# Patient Record
Sex: Female | Born: 1935 | Race: Black or African American | Hispanic: No | Marital: Married | Smoking: Never smoker
Health system: Southern US, Community
[De-identification: ages and names within clinical notes are randomized; demographics above are authoritative.]

## PROBLEM LIST (undated history)

## (undated) DIAGNOSIS — I10 Essential (primary) hypertension: Secondary | ICD-10-CM

## (undated) DIAGNOSIS — K5909 Other constipation: Secondary | ICD-10-CM

## (undated) DIAGNOSIS — Z90710 Acquired absence of both cervix and uterus: Secondary | ICD-10-CM

## (undated) DIAGNOSIS — Z9049 Acquired absence of other specified parts of digestive tract: Secondary | ICD-10-CM

## (undated) DIAGNOSIS — F0390 Unspecified dementia without behavioral disturbance: Secondary | ICD-10-CM

---

## 1998-07-24 ENCOUNTER — Emergency Department (HOSPITAL_COMMUNITY): Admission: EM | Admit: 1998-07-24 | Discharge: 1998-07-24 | Payer: Self-pay | Admitting: Emergency Medicine

## 1998-07-24 ENCOUNTER — Encounter: Payer: Self-pay | Admitting: Emergency Medicine

## 1999-03-09 ENCOUNTER — Encounter: Payer: Self-pay | Admitting: Family Medicine

## 1999-03-09 ENCOUNTER — Ambulatory Visit (HOSPITAL_COMMUNITY): Admission: RE | Admit: 1999-03-09 | Discharge: 1999-03-09 | Payer: Self-pay | Admitting: Family Medicine

## 1999-04-15 ENCOUNTER — Ambulatory Visit (HOSPITAL_COMMUNITY): Admission: RE | Admit: 1999-04-15 | Discharge: 1999-04-15 | Payer: Self-pay | Admitting: Gastroenterology

## 2002-02-18 ENCOUNTER — Encounter: Admission: RE | Admit: 2002-02-18 | Discharge: 2002-02-18 | Payer: Self-pay | Admitting: Family Medicine

## 2002-02-18 ENCOUNTER — Encounter: Payer: Self-pay | Admitting: Family Medicine

## 2004-07-30 ENCOUNTER — Ambulatory Visit (HOSPITAL_COMMUNITY): Admission: RE | Admit: 2004-07-30 | Discharge: 2004-07-30 | Payer: Self-pay | Admitting: Gastroenterology

## 2006-01-31 ENCOUNTER — Encounter: Admission: RE | Admit: 2006-01-31 | Discharge: 2006-01-31 | Payer: Self-pay | Admitting: Family Medicine

## 2006-08-25 ENCOUNTER — Emergency Department (HOSPITAL_COMMUNITY): Admission: EM | Admit: 2006-08-25 | Discharge: 2006-08-25 | Payer: Self-pay | Admitting: Emergency Medicine

## 2008-06-11 ENCOUNTER — Emergency Department (HOSPITAL_COMMUNITY): Admission: EM | Admit: 2008-06-11 | Discharge: 2008-06-11 | Payer: Self-pay | Admitting: Family Medicine

## 2008-06-12 ENCOUNTER — Inpatient Hospital Stay (HOSPITAL_COMMUNITY): Admission: EM | Admit: 2008-06-12 | Discharge: 2008-06-17 | Payer: Self-pay | Admitting: Internal Medicine

## 2008-06-12 ENCOUNTER — Ambulatory Visit (HOSPITAL_COMMUNITY): Admission: RE | Admit: 2008-06-12 | Discharge: 2008-06-12 | Payer: Self-pay | Admitting: Family Medicine

## 2008-06-16 ENCOUNTER — Encounter (INDEPENDENT_AMBULATORY_CARE_PROVIDER_SITE_OTHER): Payer: Self-pay | Admitting: *Deleted

## 2009-02-21 ENCOUNTER — Emergency Department (HOSPITAL_COMMUNITY): Admission: EM | Admit: 2009-02-21 | Discharge: 2009-02-21 | Payer: Self-pay | Admitting: Emergency Medicine

## 2010-07-25 ENCOUNTER — Encounter: Payer: Self-pay | Admitting: Family Medicine

## 2010-11-16 NOTE — Discharge Summary (Signed)
[name], [name]                ACCOUNT NO.:  [id number]   MEDICAL RECORD NO.:  [id number]          PATIENT TYPE:  INP   LOCATION:  5502                         FACILITY:  [facility]   PHYSICIAN:  Beckey Rutter, MD  DATE OF BIRTH:  [birth year]   DATE OF ADMISSION:  06/12/2008  DATE OF DISCHARGE:  06/17/2008                               DISCHARGE SUMMARY   PRIMARY CARE PHYSICIAN:  Renaye Rakers, MD   CHIEF COMPLAINT ON PRESENTATION:  Nausea and vomiting.   Please refer to the H and P dictated on the day of admission for the  history of present illness.   HOSPITAL COURSE:  1. Small bowel obstruction.  The patient was treated conservatively      with first n.p.o. and then the diet advanced slowly.  The patient      is able to tolerate regular diet currently.   The patient was seen in consultation by [facility], who  helped with the conservative management.  1. Acute renal failure.  The patient was noticed to have elevated      creatinine on admission.  With hydration and now after regular      diet, the patient's renal failure improved and her current      creatinine is 1.01.  2. Malnutrition.  The patient will be sent with Ensure      supplementation.  3. History of hypertension.  Blood pressure remained stable during the      hospital stay.  She did not require any antihypertensive      medication.  4. Dirty urine with possibility of urinary tract infection.  The urine      collection showed more than 1000 colonies of nonspecific organism.      The patient has no any symptoms of urinary tract infection and she      did not receive antibiotic.  It is recommended that the patient      need to have repeat urinalysis and culture as needed.  5. It was noted that the patient had episode of bradycardia when she      is sleeping, which is totally asymptomatic.  The patient had 2-D      echo for evaluation and she has been monitored on tele floor since      the admission.   The patient is stable for discharge today.   HOSPITAL PROCEDURES:  A 2-D echo done on June 16, 2008.  Impression  was:  1. Overall left ventricular systolic function was normal.  Left      ventricular ejection fraction was estimated to be 60%.  There were      no left ventricular regional wall motion abnormalities.  There was      an increased relative contribution of the atrial contraction to the      left ventricular filling.  Left ventricular diastolic function      parameters were normal.  2. Aortic valve thickness was moderately increased.  Findings were      consistent with very mild aortic valve stenosis.  The mean  transaortic valve gradient was 9 mmHg.  Estimated aortic valve area      by VTI was 1.52 cm2.  Estimated aortic valve area up by Vmax was      1.43 cm2.   There was moderate mitral annular calcification.   There was the appearance of Chiari malformation.   1. On June 12, 2008, the patient CT of abdomen was showing a small      bowel obstruction likely due to adhesions in the pelvis.  The      abdomen CT scan done at the same time is showing dilated small      bowel loops consistent with small bowel obstruction.  No free air.  2. Dilated common bile duct might be age related, but recommend      correlation with liver function studies.  3. Atherosclerotic change involving the aorta and branch vessels.      Mild atrophy of the pancreas.   The abdomen x-ray done the next day to evaluate SBO was showing  improvement in the small bowel obstruction.   On June 16, 2008, yesterday, the patient had KUB/abdominal x-ray.  Impression is continued improvement of small bowel obstruction.   LABORATORY RESULTS:  White blood count of 5.4, hemoglobin is 10.6,  hematocrit 30.9, and platelet count is 196.  Sodium is 138, potassium  4.1, chloride 108, bicarb 26, glucose 95, BUN 8, and creatinine 1.03.  Folic acid is 47.8, vitamin B12 is 220, and TSH is 0.97.    DISCHARGE DIAGNOSES:  1. Small bowel obstruction. resolved  2. Acute renal failure, resolved.  3. Dehydration.   DISCHARGE MEDICATIONS:  1. Multivitamins tabs daily.  2. Cyanocobalamin tablets 250 mcg p.o. daily.  3. Prilosec over-the-counter 20 mg p.o. daily.   The patient was advised to follow up with Dr. Renaye Rakers within the  next week.  She is stable for discharge.      Beckey Rutter, MD  Electronically Signed     EME/MEDQ  D:  06/17/2008  T:  06/18/2008  Job:  [id number]

## 2010-11-16 NOTE — H&P (Signed)
NAMEJAZIAH, [name] NO.:  [id number]   MEDICAL RECORD NO.:  [id number]          PATIENT TYPE:  INP   LOCATION:  5502                         FACILITY:  [facility]   PHYSICIAN:  Eduard Clos, MDDATE OF BIRTH:  March 22, 1915   DATE OF ADMISSION:  06/12/2008  DATE OF DISCHARGE:                              HISTORY & PHYSICAL   History from Dr. Renaye [name], patient's primary care physician.   CHIEF COMPLAINT:  Nausea and vomiting.   HISTORY OF PRESENT ILLNESS:  A 77 year old female with a history of  hypertension has been having nausea and vomiting for the last 24 hours.  Patient states that she does not have any nausea, feels better.  Denies  any abdominal pain.  Patient's primary care physician, Dr. Renaye [name],  had CAT scan of the abdomen and pelvis done which showed small bowel  obstruction.  Patient was referred to the hospital for direct admission.  Patient presently is not in any acute distress.  Denies any chest pain,  shortness of breath, weakness of the limbs.  Denies any nausea or  vomiting, abdominal pain.  No diarrhea.  Denies any fevers or chills.   PAST MEDICAL HISTORY:  Pertinent positives:  1. Hypertension.  2. Arthritis.   PAST SURGICAL HISTORY:  1. Hysterectomy.  2. Appendectomy.   MEDICATIONS PRIOR TO ADMISSION:  At this time, not known.  Will try to  contact family.   ALLERGIES:  No known drug allergies.   FAMILY HISTORY:  Nothing contributory.   SOCIAL HISTORY:  Patient denies smoking cigarettes, drinking alcohol,  using illegal drugs.   REVIEW OF SYSTEMS:  As per the history of present illness, nothing else  significant.   PHYSICAL EXAMINATION:  Patient examined at bedside.  Not in acute  distress.  VITAL SIGNS:  Blood pressure is 110/44, pulse 60 per minute, temperature  97.5, respirations 18 per minute, O2 sat 99%.  HEENT:  Anicteric.  No pallor.  CHEST:  Bilateral air entry present bilaterally.  No rhonchi, no  crepitation.  HEART:  S1 and S2 heard.  ABDOMEN:  Soft.  Nontender.  Bowel sounds not appreciated.  No guarding,  no rigidity.  CNS:  Awake, alert and oriented to time, place, and person.  Moves upper  and lower extremities 5/5.  EXTREMITIES:  Peripheral pulses felt.  No edema.   LABS:  CT of the abdomen and pelvis shows dilated small bowel loops,  consistent with small bowel obstruction.  No free air.  age related.  Will recommend correlation with liver function tests.  CT of the pelvis shows small bowel obstruction, likely due to adhesions  in the pelvis.   CMET, CBC, and TSH have been ordered.  Results are pending.   ASSESSMENT:  1. Small bowel obstruction, probably due to adhesions in the pelvis.  2. Dehydration.  3. History of hypertension.   PLAN:  We will admit patient to telemetry, as requested by Dr. Renaye [name].  Will place patient on IV fluids, n.p.o.  If patient has no  further nausea or vomiting, probably will try a clear-liquid diet  in the  a.m.  Monitor for appreciable bowel sounds.  If patient has persistent  nausea and vomiting, will need surgical consult.      Eduard Clos, MD  Electronically Signed     ANK/MEDQ  D:  06/13/2008  T:  06/13/2008  Job:  [id number]

## 2010-11-16 NOTE — Discharge Summary (Signed)
NAMEMarland Kitchen  CHELCY, BOLDA NO.:  [id number]   MEDICAL RECORD NO.:  [id number]           PATIENT TYPE:   LOCATION:                                 FACILITY:   PHYSICIAN:  Beckey Rutter, MD  DATE OF BIRTH:  Oct 12, 1914   DATE OF ADMISSION:  DATE OF DISCHARGE:                               DISCHARGE SUMMARY   ADDENDUM   PRIMARY CARE PHYSICIAN:  Renaye Rakers, MD, please CC  copy to her office   Ms. Righter had a screening for syphilis while she is in the hospital,  although she does not have symptoms of dementia.  The RPR screening is  reactive and the RPR titer is 1:64.  Now, I discussed the result with  Ms. Marquina and she denied any history of syphilis in the past.  She has  no complaint now, and she stated clearly that she does not want to be  treated  right now for syphilis if that would be the case.  Ms. Alguire  will be discharged today, and I advised her to discuss this issue and  result with her primary physician if she would change her mind.  She is  aware and agreeable to discharge plan.  Ms. Sermons is currently alert and  oriented x3 and does not have signs of dementia.      Beckey Rutter, MD  Electronically Signed     EME/MEDQ  D:  06/17/2008  T:  06/18/2008  Job:  [id number]   cc:   Renaye Rakers, M.D.

## 2010-11-16 NOTE — Consult Note (Signed)
NAMEZANYLAH, HARDIE NO.:  [id number]   MEDICAL RECORD NO.:  [id number]          PATIENT TYPE:  INP   LOCATION:  5502                         FACILITY:  [facility]   PHYSICIAN:  Wilmon Arms. Corliss Skains, M.D. DATE OF BIRTH:  Dec 28, 1914   DATE OF CONSULTATION:  06/13/2008  DATE OF DISCHARGE:                                 CONSULTATION   REQUESTING PHYSICIAN:  Beckey Rutter, MD   CONSULTING SURGEON:  Wilmon Arms. Corliss Skains, MD.   PRIMARY CARE PHYSICIAN:  Renaye Rakers, MD   REASON FOR CONSULTATION:  Small bowel obstruction.   HISTORY OF PRESENT ILLNESS:  Ms. Frandsen is a 77 year old black female  with a history of hypertension as well as arthritis who presented to her  primary care physician's office yesterday with complaint of urinary  frequency where she was found to have a urinary tract infection.  She  also had a complaint of abdominal pain along with nausea and vomiting.  At this time, apparently she had had this going on for several days.  Her last bowel movement was 3 days ago.  At this time due to the  patient's complaint, Dr. Parke Simmers sent the patient for a CT scan of the  abdomen and pelvis.  This came back showing a small bowel obstruction  and therefore the patient was sent over for direct admission to [facility].  Upon admission, the patient was kept n.p.o. with bowel rest.  NG  tube at that time was not placed as the patient was no longer having  nausea or vomiting.  Currently, today the patient states that she is  still not passing flatus.  However, she no longer has any abdominal pain  or nausea and vomiting.  Currently, the patient has an abdominal x-ray  pending.  At this time, we were consulted to see the patient to help  with management of small bowel obstruction.   REVIEW OF SYSTEMS:  Negative except for complaint of urinary frequency  and all other complaints as per history of present illness.   FAMILY HISTORY:  Noncontributory.   PAST MEDICAL HISTORY:  1. Hypertension.  2. Arthritis.   PAST SURGICAL HISTORY:  1. Status post hysterectomy.  2. Status post appendectomy.   SOCIAL HISTORY:  The patient was married; however, her husband is  currently deceased.  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  Otherwise, she denies smoking; however, she did smoke when she  was younger, however, she has been smoking for many many years.  She  currently denies any alcohol; however, she states when she was younger,  she did drink beer.   ALLERGIES:  NKDA.   MEDICATIONS:  Home medications are not known.  However, while in the  hospital she is currently on Lovenox 30 mg subcu q.8 h., Protonix 40 mg  IV, and hydralazine 10 mg IV q.6 h. p.r.n.   PHYSICAL EXAMINATION:  GENERAL:  Ms. Gilbertson is a 77 year old black female  who is currently lying  in bed, very pleasant in no acute distress.  VITALS:  Temperature 98.7, pulse 53, respirations 20, blood pressure  102/44.  HEENT:  Head is normocephalic and atraumatic.  Sclerae noninjected.  Pupils are equal, round, reactive to light.  Ears and nose without any  obvious masses or lesions.  No rhinorrhea.  Mouth is pink and moist.  However, she does have malodorous breath that tends to smell somewhat  feculent.  Otherwise, throat shows no exudate.  Neck:  Supple.  Trachea is midline.  No thyromegaly.  HEART:  Regular rate and rhythm; however, mild bradycardic, otherwise S1  and S2 are normal.  No murmurs, gallops, or rubs are noted.  +2 carotid,  radial, and pedal pulses bilaterally.  LUNGS:  Clear to auscultation bilaterally with no wheezes, rhonchi, or  rales noted.  Respiratory is nonlabored.  ABDOMEN:  Soft, nontender, nondistended with active bowel sounds.  No  masses or hernias are noted.  She does have a scar in the right lower  quadrant that is weak from her appendectomy.  Otherwise, I am unable to  currently identify  her other scar from her hysterectomy.  MUSCULOSKELETAL:  All four extremities are symmetrical with no cyanosis,  clubbing, or edema.  SKIN:  Warm and dry without any obvious masses, lesions, or rashes.  NEURO:  Cranial nerves II through XII appear to be grossly intact.  PSYCH:  The patient is alert and oriented x3 with an appropriate affect.   LABS AND DIAGNOSTIC:  Amylase 79, lipase is 14.  White blood cell count  of 7300, hemoglobin 10.7, hematocrit 31.9, platelet count is 201,000,  neutrophil count 79%.  Sodium 139, potassium 3.4, glucose 132, BUN 26,  creatinine 1.81, total bilirubin 0.8, alkaline phosphatase 53, AST 20,  ALT 10.  Urine culture shows greater than 100,000 colonies with multiple  bacteria morphotypes.   DIAGNOSTICS:  CT of the abdomen and pelvis revealed dilated small bowel  loops consistent with small bowel obstruction.  No free air.  A dilated  common bile duct is seen.  However, felt that this may be related to her  age, otherwise the pelvis revealed small bowel obstruction likely due to  adhesions in pelvis.  Currently, a two-view abdominal x-ray is pending.   IMPRESSION:  1. Small bowel obstruction.  2. Urinary tract infection.  3. Acute renal failure probably secondary to his dehydration.  4. History of hypertension.  5. Arthritis.   PLAN:  At this time, the patient seems to be improving with n.p.o. and  bowel rest.  However, she has not had a followup x-ray since her CT scan  yesterday and currently this has been ordered and awaiting x-ray.  In  the meantime since the patient is no longer having abdominal pain,  nausea, or vomiting, I do not feel that she needs an NG tube at this  time.  However, if she begins to worsen, becomes more distended, and  begins to have nausea and emesis, an NG tube may be needed.  Otherwise,  the patient even though feeling better is currently still not passing  gas or having bowel movements, so I would continue her n.p.o. and  bowel  rest until this occurs.  Also on CT scan, it does show  that the patient has some stool in her colon and therefore I will give  her a suppository to see if this can stimulate her bowels.  Otherwise,  pending the patient's x-ray today, she may need a followup x-ray in  the  morning, but that will be determined later after today's x-ray.  Otherwise we will follow along with you.  Thank you for this consult.      Letha Cape, PA      Wilmon Arms. Tsuei, M.D.  Electronically Signed    KEO/MEDQ  D:  06/13/2008  T:  06/13/2008  Job:  [id number]   cc:   Beckey Rutter, MD  Renaye Rakers, M.D.

## 2010-11-19 NOTE — Op Note (Signed)
NAME:  [name], [name]                ACCOUNT NO.:  [id number]   MEDICAL RECORD NO.:  [id number]          PATIENT TYPE:  AMB   LOCATION:  ENDO                         FACILITY:  [facility]   PHYSICIAN:  Anselmo Rod, M.D.  DATE OF BIRTH:  25-Dec-1914   DATE OF PROCEDURE:  07/30/2004  DATE OF DISCHARGE:                                 OPERATIVE REPORT   PROCEDURE PERFORMED:  Screening colonoscopy.   ENDOSCOPIST:  Charna Elizabeth, M.D.   INSTRUMENT USED:  Olympus video colonoscope.   INDICATIONS FOR PROCEDURE:  An 77 year old African-American female  undergoing a screening colonoscopy.  The patient has a family history of  colon cancer; rule out colonic polyps, masses, etc.   PREPROCEDURE PREPARATION:  Informed consent was procured from the patient.  The patient was fasted for 8 hr prior to the procedure and prepped with a  bottle of magnesium citrate and a gallon of GoLYTELY the night prior to the  procedure.   PREPROCEDURE PHYSICAL:  VITAL SIGNS:  Stable.  NECK:  Supple.  CHEST:  Clear to auscultation.  HEART:  S1, S2 regular.  ABDOMEN:  Soft with normal bowel sounds.   DESCRIPTION OF PROCEDURE:  The patient was placed in the left lateral  decubitus position and sedated with 50 mg Demerol and  2 mg Versed in slow incremental doses.  Once the patient was adequately  sedated and maintained on low-flow oxygen and continuous cardiac monitoring,  the Olympus video colonoscope was advanced from the rectum to the cecum.  The appendiceal orifice and the ileocecal valve were clearly visualized and  photographed.  There were significant amount of residual stool in the cecum.  Multiple washes were done and cecal base visualized.  No masses, polyps,  erosions, ulcerations or diverticula were seen.  Small lesions could have  been missed.   Retroflexion in the rectum revealed no abnormalities.  The patient tolerated  the procedure well without complications.   IMPRESSION:  1.  Unrevealing  colonoscopy up to the cecum.  2.  No masses, polyps or diverticula seen.  3.  Significant amount of residual stool in the colon; small lesions could      have been missed.   RECOMMENDATIONS:  1.  Continue on a high-fiber diet, with liberal fluid intake.  2.  Repeat colonoscopy the next 5 years, considering the patient's family      history of colon cancer, unless the patient develops some abnormal      symptoms in the interim.  3.  Outpatient as needed arises in the future.      JNM/MEDQ  D:  07/30/2004  T:  07/30/2004  Job:  [id number]   cc:   Renaye Rakers, M.D.  [phone] [address], [address]  [place]  [place] [address]  Fax: [phone]

## 2011-01-24 ENCOUNTER — Encounter (HOSPITAL_COMMUNITY): Payer: Self-pay | Admitting: Radiology

## 2011-01-24 ENCOUNTER — Emergency Department (HOSPITAL_COMMUNITY): Payer: Medicare Other

## 2011-01-24 ENCOUNTER — Inpatient Hospital Stay (HOSPITAL_COMMUNITY)
Admission: EM | Admit: 2011-01-24 | Discharge: 2011-01-28 | DRG: 389 | Disposition: A | Discharge status: Home / Self Care | Payer: Medicare Other | Attending: Internal Medicine | Admitting: Internal Medicine

## 2011-01-24 DIAGNOSIS — R188 Other ascites: Secondary | ICD-10-CM | POA: Diagnosis present

## 2011-01-24 DIAGNOSIS — Z880 Allergy status to penicillin: Secondary | ICD-10-CM

## 2011-01-24 DIAGNOSIS — I10 Essential (primary) hypertension: Secondary | ICD-10-CM | POA: Diagnosis present

## 2011-01-24 DIAGNOSIS — F29 Unspecified psychosis not due to a substance or known physiological condition: Secondary | ICD-10-CM | POA: Diagnosis present

## 2011-01-24 DIAGNOSIS — E860 Dehydration: Secondary | ICD-10-CM | POA: Diagnosis present

## 2011-01-24 DIAGNOSIS — K565 Intestinal adhesions [bands], unspecified as to partial versus complete obstruction: Principal | ICD-10-CM | POA: Diagnosis present

## 2011-01-24 DIAGNOSIS — K5900 Constipation, unspecified: Secondary | ICD-10-CM | POA: Diagnosis present

## 2011-01-24 HISTORY — DX: Acquired absence of both cervix and uterus: Z90.710

## 2011-01-24 HISTORY — DX: Acquired absence of other specified parts of digestive tract: Z90.49

## 2011-01-24 HISTORY — DX: Other constipation: K59.09

## 2011-01-24 HISTORY — DX: Unspecified dementia, unspecified severity, without behavioral disturbance, psychotic disturbance, mood disturbance, and anxiety: F03.90

## 2011-01-24 HISTORY — DX: Essential (primary) hypertension: I10

## 2011-01-24 LAB — CBC
MCV: 89.3 fL (ref 78.0–100.0)
Platelets: 216 10*3/uL (ref 150–400)
RBC: 4.6 MIL/uL (ref 3.87–5.11)
RDW: 13 % (ref 11.5–15.5)
WBC: 10.1 10*3/uL (ref 4.0–10.5)

## 2011-01-24 LAB — DIFFERENTIAL
Basophils Absolute: 0 10*3/uL (ref 0.0–0.1)
Basophils Relative: 0 % (ref 0–1)
Eosinophils Absolute: 0 10*3/uL (ref 0.0–0.7)
Eosinophils Relative: 0 % (ref 0–5)
Neutrophils Relative %: 90 % — ABNORMAL HIGH (ref 43–77)

## 2011-01-24 LAB — URINALYSIS, ROUTINE W REFLEX MICROSCOPIC
Bilirubin Urine: NEGATIVE
Leukocytes, UA: NEGATIVE
Nitrite: NEGATIVE
Specific Gravity, Urine: 1.018 (ref 1.005–1.030)
Urobilinogen, UA: 1 mg/dL (ref 0.0–1.0)

## 2011-01-24 LAB — POCT I-STAT, CHEM 8
Calcium, Ion: 1.13 mmol/L (ref 1.12–1.32)
Glucose, Bld: 165 mg/dL — ABNORMAL HIGH (ref 70–99)
HCT: 44 % (ref 36.0–46.0)
Hemoglobin: 15 g/dL (ref 12.0–15.0)
TCO2: 23 mmol/L (ref 0–100)

## 2011-01-24 LAB — URINE MICROSCOPIC-ADD ON

## 2011-01-24 MED ORDER — IOHEXOL 300 MG/ML  SOLN
80.0000 mL | Freq: Once | INTRAMUSCULAR | Status: AC | PRN
  Administered 2011-01-24: 80 mL via INTRAVENOUS

## 2011-01-25 ENCOUNTER — Inpatient Hospital Stay (HOSPITAL_COMMUNITY): Payer: Medicare Other

## 2011-01-25 DIAGNOSIS — K56609 Unspecified intestinal obstruction, unspecified as to partial versus complete obstruction: Secondary | ICD-10-CM

## 2011-01-25 LAB — CBC
HCT: 34.1 % — ABNORMAL LOW (ref 36.0–46.0)
MCH: 31.6 pg (ref 26.0–34.0)
MCV: 89 fL (ref 78.0–100.0)
RDW: 13.3 % (ref 11.5–15.5)
WBC: 11.9 10*3/uL — ABNORMAL HIGH (ref 4.0–10.5)

## 2011-01-25 LAB — COMPREHENSIVE METABOLIC PANEL
Albumin: 3.5 g/dL (ref 3.5–5.2)
BUN: 33 mg/dL — ABNORMAL HIGH (ref 6–23)
CO2: 22 mEq/L (ref 19–32)
Chloride: 103 mEq/L (ref 96–112)
Creatinine, Ser: 1.21 mg/dL — ABNORMAL HIGH (ref 0.50–1.10)
GFR calc non Af Amer: 41 mL/min — ABNORMAL LOW (ref >60)
Total Bilirubin: 0.6 mg/dL (ref 0.3–1.2)

## 2011-01-25 LAB — MAGNESIUM: Magnesium: 2.2 mg/dL (ref 1.5–2.5)

## 2011-01-25 LAB — GLUCOSE, CAPILLARY: Glucose-Capillary: 116 mg/dL — ABNORMAL HIGH (ref 70–99)

## 2011-01-26 ENCOUNTER — Inpatient Hospital Stay (HOSPITAL_COMMUNITY): Payer: Medicare Other

## 2011-01-26 LAB — GLUCOSE, CAPILLARY
Glucose-Capillary: 108 mg/dL — ABNORMAL HIGH (ref 70–99)
Glucose-Capillary: 112 mg/dL — ABNORMAL HIGH (ref 70–99)
Glucose-Capillary: 116 mg/dL — ABNORMAL HIGH (ref 70–99)
Glucose-Capillary: 116 mg/dL — ABNORMAL HIGH (ref 70–99)

## 2011-01-26 LAB — CBC
HCT: 35.9 % — ABNORMAL LOW (ref 36.0–46.0)
MCHC: 34.3 g/dL (ref 30.0–36.0)
Platelets: 211 10*3/uL (ref 150–400)
RDW: 13.3 % (ref 11.5–15.5)

## 2011-01-26 LAB — BASIC METABOLIC PANEL
Calcium: 8.9 mg/dL (ref 8.4–10.5)
GFR calc Af Amer: 59 mL/min — ABNORMAL LOW (ref >60)
GFR calc non Af Amer: 49 mL/min — ABNORMAL LOW (ref >60)
Glucose, Bld: 116 mg/dL — ABNORMAL HIGH (ref 70–99)
Sodium: 141 mEq/L (ref 135–145)

## 2011-01-26 NOTE — H&P (Signed)
[name], [name] NO.:  [id number]  MEDICAL RECORD NO.:  [id number]  LOCATION:  [facility]                         FACILITY:  [facility]  PHYSICIAN:  Eduard Clos, MDDATE OF BIRTH:  1915/04/27  DATE OF ADMISSION:  01/24/2011 DATE OF DISCHARGE:                             HISTORY & PHYSICAL   PRIMARY CARE PHYSICIAN:  Renaye Rakers, MD  CHIEF COMPLAINT:  Nausea, vomiting, and abdominal pain.  HISTORY OF PRESENT ILLNESS:  A 77 year old female with previous history of small-bowel obstruction, hypertension, arthritis has been experiencing abdominal pain, nausea, vomiting over the last 2 days because her symptoms have been persistent.  The patient's granddaughter brought her to the ER.  In the ER, the patient had a CT abdomen and pelvis which shows distal small bowel obstruction of uncertain etiology. The patient has been admitted for further workup.  Dr. Magnus Ivan of surgery on-call was consulted by ER physician; at this time, hospitalist admission has been requested.  The patient did not complain of any chest pain or shortness of breath, did not have any cough or phlegm, any fever or chills, headache or any loss of consciousness, any focal deficit, any dysuria, discharge, or diarrhea.  PAST MEDICAL HISTORY:  Hypertension and arthritis.  PAST SURGICAL HISTORY:  Appendectomy and hysterectomy.  MEDICATIONS ON ADMISSION:  Losartan, hydrochlorothiazide 100/25 mg p.o. daily.  ALLERGIES:  No known drug allergies.  FAMILY HISTORY:  Nothing contributory.  SOCIAL HISTORY:  The patient denies smoking cigarettes, drinking alcohol, or using illegal drugs.  REVIEW OF SYSTEMS:  As per the history of present illness and nothing else significant.  ALLERGIES:  PENICILLIN.  PHYSICAL EXAMINATION:  GENERAL:  The patient examined at bedside not in any acute distress. VITAL SIGNS:  Blood pressure 120/40, pulse is 54 per minute, temperature is 98.7, respirations 18, O2 sat  100%. HEENT:  Anicteric.  No pallor.  No discharge from ears, eyes, nose, or mouth. CHEST:  Bilateral air entry present.  No rhonchi.  No crepitation. HEART:  S1 and S2 heard. ABDOMEN:  Soft, distended, bowel sounds not appreciated.  No guarding or rigidity. CNS:  The patient is mildly drowsy.  She is moving upper and lower extremities.  The patient was alert and awake few minutes ago.  She did cooperate in taking the contrast. EXTREMITIES:  Peripheral pulses felt.  No edema.  LABORATORY DATA:  X-ray of abdomen shows  midabdominal small- bowel loops, may represent early or partial obstruction or ileus. Followup films or CT may be useful if symptoms persist.  CT of abdomen and pelvis with contrast shows small bowel obstruction of uncertain etiology, small amount of perihepatic and pelvic ascites, gallbladder sludge or stones,aortic and iliofemoral arterial calcification.  CBC:  WBCs 10.1, hemoglobin is 15, hematocrit 44, platelets 216.  Basic metabolic panel sodium 141, potassium 4.5, chloride 111, glucose 165, BUN 36, creatinine 1.4.  UA is negative for nitrite and leukocytes. Urine glucose negative, ketones 15, blood negative.  ASSESSMENT: 1. Small-bowel obstruction. 2. History of hypertension. 3. Mild dehydration. 4. Admit patient to medical floor. 5. For her small-bowel obstruction at this time we will keep the     patient n.p.o. if the  patient does have any recurrence of nausea,     vomiting, we need to keep NG tube with suction.  At this time, ER     physician Dr. Magnus Ivan of Surgery.  We will follow his     recommendation.  The patient will be hydrated with IV fluids. 6. The patient does have mild hyperglycemia and we will check     hemoglobin A1c. 7. Hypertension.  At this time, we will keep the patient on p.r.n.     hydralazine as needed and further recommendation as condition     evolves.     Eduard Clos, MD     ANK/MEDQ  D:  01/25/2011  T:   01/25/2011  Job:  [id number]  cc:   Renaye Rakers, M.D.  Electronically Signed by Midge Minium MD on 01/26/2011 01:21:37 AM

## 2011-01-27 ENCOUNTER — Inpatient Hospital Stay (HOSPITAL_COMMUNITY): Payer: Medicare Other

## 2011-01-30 NOTE — Consult Note (Signed)
[name], [name] NO.:  [id number]  MEDICAL RECORD NO.:  [id number]  LOCATION:                                 FACILITY:  PHYSICIAN:  Abigail Miyamoto, M.D. DATE OF BIRTH:  [birth year]  DATE OF CONSULTATION:  01/25/2011 DATE OF DISCHARGE:                                CONSULTATION   REFERRING PHYSICIAN:  Eduard Clos, MD  CHIEF COMPLAINT:  Bowel obstruction.  HISTORY:  This is a 77 year old female who presents with about 1- to 2- day history of generalized crampy abdominal pain, nausea, and vomiting. The patient's daughter reports that she developed this on Monday.  She did give her an enema and she had a bowel movement.  Currently, she is now pain free and passing some flatus.  She has no other complaints.  PAST MEDICAL HISTORY: 1. Previous small bowel obstruction which resolved with conservative     measures in 2009. 2. Hypertension. 3. Arthritis.  PAST SURGICAL HISTORY: 1. Appendectomy. 2. Hysterectomy.  MEDICATIONS:  Please see universal medical reconciliation form.  This does include hydrochlorothiazide.  ALLERGIES:  PENICILLIN.  FAMILY HISTORY:  Noncontributory.  SOCIAL HISTORY:  The patient does not smoke, does not drink alcohol. She lives with her daughter.  REVIEW OF SYSTEMS:  GENERAL:  Negative fever or chills.  PULMONARY: Negative for cough, shortness of breath, or difficulty breathing. CARDIAC:  Negative for chest pain or irregular heartbeat.  ABDOMEN: Listed as above.  URINARY:  Negative for dysuria or hematuria.  There was no hematemesis.  The rest of the review of systems, skin, eyes, ears, nose, and throat, musculoskeletal, neurologic, psychiatric, and endocrine are normal.  PHYSICAL EXAMINATION:  GENERAL:  An elderly female, in no acute distress. VITAL SIGNS:  Temperature 98.5, heart rate 56, respiratory rate 20, blood pressure 147/63.  She is satting 99% on room air. HEENT:  Eyes anicteric.  Pupils reactive  bilaterally.  ENT:  External ears and nose are normal.  Hearing is diminished.  Oropharynx is clear. NECK:  Supple.  Trachea is midline.  There is no thyromegaly. LUNGS:  Clear to auscultation bilaterally with normal respiratory effort. CARDIOVASCULAR:  Regular rate and rhythm.  There are no murmurs.  No peripheral edema. ABDOMEN:  Currently soft.  It is nontender, nondistended.  There are no masses.  There are no hernias.  There is no organomegaly. EXTREMITIES:  Warm, well perfused.  No edema, clubbing, or cyanosis. Peripheral pulses are intact in all four extremities. SKIN:  No rash and no jaundice. MUSCULOSKELETAL:  Normal motor and sensory function and strength grossly intact to all four extremities. NEUROLOGIC:  She is awake, alert, and oriented.  Judgment and affect appear normal.  DATA REVIEWED:  The patient has white blood count that is normal at 10.1.  Creatinine is normal. The patient had a CAT scan of the abdomen and pelvis which shows dilated proximal with collapse of some small bowel consistent with a small bowel obstruction.  There is a small amount of intra-abdominal ascites.  IMPRESSION:  This is a patient with a partial small bowel obstruction which I suspect is from adhesions.  She does appear to be resolving with conservative measures.  We will continue conservative management for now with bowel rest.  She may or may not need a nasogastric tube.  We will make the decision depending on her x-rays and her clinical status. Hopefully, we will be able to avoid exploration in the operating room. We will follow her closely with you.     Abigail Miyamoto, M.D.     DB/MEDQ  D:  01/25/2011  T:  01/25/2011  Job:  [id number]  Electronically Signed by Abigail Miyamoto M.D. on 01/30/2011 02:05:05 PM

## 2011-01-31 NOTE — Discharge Summary (Signed)
  NAMEBETTYLOU, FREW NO.:  [id number]  MEDICAL RECORD NO.:  [id number]  LOCATION:  5121                         FACILITY:  [facility]  PHYSICIAN:  Lonia Blood, M.D.       DATE OF BIRTH:  Jun 17, 1915  DATE OF ADMISSION:  01/24/2011 DATE OF DISCHARGE:  01/28/2011                              DISCHARGE SUMMARY   PRIMARY CARE PHYSICIAN:  Renaye Rakers, MD  DISCHARGE DIAGNOSES: 1. Partial small bowel obstruction, resolved. 2. Constipation. 3. Hypertension. 4. Mild dehydration, resolved.  DISCHARGE MEDICATIONS: 1. Losartan/HCTZ 100/25 one daily. 2. MiraLax 17 g daily as needed for constipation.  CONDITION ON DISCHARGE:  [name] was discharged in good condition. but tolerate a regular diet, hemodynamically stable, afebrile.  The patient to follow up with her primary care physician, Dr. Renaye Rakers, in 1 week after discharge.  PROCEDURE THIS ADMISSION:  The patient underwent a CT scan of abdomen and pelvis on January 24 1011, showing distal small-bowel obstruction of uncertain etiology, small amount of perihepatic pelvic ascites, gallbladder sludge or stones, coronary, aortic, anterior, femoral arterial calcifications.  CONSULTATION THIS ADMISSION:  The patient was seen in consultation by [facility].  HOSPITAL COURSE:  [name] is a 76 year old woman with a history of hypertension, was admitted to the hospital with nausea and vomiting.  CT scan showed partial distal small bowel obstruction.  She was placed on bowel rest and was seen on a daily basis by the [facility].  She had never required any surgical interventions.  Her symptoms resolved completely.  She was able to be advanced on a clear liquid diet and then a regular diet. Otherwise, she is discharged in successful condition on January 28, 2011. In the outpatient setting, she will need a scheduled regular colonoscopy for colon cancer screening purposes and a daily  laxatives.     Lonia Blood, M.D.     SL/MEDQ  D:  01/28/2011  T:  01/29/2011  Job:  [id number]  cc:   Renaye Rakers, M.D.  Electronically Signed by Lonia Blood M.D. on 01/31/2011 81:19:14 PM

## 2011-04-08 LAB — CBC
Hemoglobin: 10.7 g/dL — ABNORMAL LOW (ref 12.0–15.0)
Hemoglobin: 11.2 g/dL — ABNORMAL LOW (ref 12.0–15.0)
Hemoglobin: 12.2 g/dL (ref 12.0–15.0)
MCHC: 33.6 g/dL (ref 30.0–36.0)
MCHC: 34.3 g/dL (ref 30.0–36.0)
MCV: 96.2 fL (ref 78.0–100.0)
Platelets: 196 10*3/uL (ref 150–400)
Platelets: 210 10*3/uL (ref 150–400)
RBC: 3.35 MIL/uL — ABNORMAL LOW (ref 3.87–5.11)
RBC: 3.76 MIL/uL — ABNORMAL LOW (ref 3.87–5.11)
RDW: 13 % (ref 11.5–15.5)
RDW: 13 % (ref 11.5–15.5)
WBC: 5.6 10*3/uL (ref 4.0–10.5)
WBC: 6.2 10*3/uL (ref 4.0–10.5)

## 2011-04-08 LAB — COMPREHENSIVE METABOLIC PANEL
ALT: 10 U/L (ref 0–35)
AST: 20 U/L (ref 0–37)
CO2: 27 mEq/L (ref 19–32)
Chloride: 100 mEq/L (ref 96–112)
GFR calc Af Amer: 32 mL/min — ABNORMAL LOW (ref >60)
GFR calc non Af Amer: 26 mL/min — ABNORMAL LOW (ref >60)
Sodium: 139 mEq/L (ref 135–145)
Total Bilirubin: 0.8 mg/dL (ref 0.3–1.2)

## 2011-04-08 LAB — BASIC METABOLIC PANEL
BUN: 8 mg/dL (ref 6–23)
BUN: 9 mg/dL (ref 6–23)
CO2: 26 mEq/L (ref 19–32)
CO2: 28 mEq/L (ref 19–32)
Calcium: 8.8 mg/dL (ref 8.4–10.5)
Calcium: 8.9 mg/dL (ref 8.4–10.5)
Chloride: 108 mEq/L (ref 96–112)
Creatinine, Ser: 1.01 mg/dL (ref 0.4–1.2)
Creatinine, Ser: 1.03 mg/dL (ref 0.4–1.2)
GFR calc Af Amer: >60 mL/min (ref >60)
GFR calc non Af Amer: 51 mL/min — ABNORMAL LOW (ref >60)
GFR calc non Af Amer: >60 mL/min (ref >60)
Glucose, Bld: 95 mg/dL (ref 70–99)
Sodium: 135 mEq/L (ref 135–145)
Sodium: 140 mEq/L (ref 135–145)

## 2011-04-08 LAB — T.PALLIDUM AB, IGG: T pallidum Antibodies (TP-PA): 13.9 IV — ABNORMAL HIGH (ref <1.0)

## 2011-04-08 LAB — URINALYSIS, ROUTINE W REFLEX MICROSCOPIC
Bilirubin Urine: NEGATIVE
Hgb urine dipstick: NEGATIVE
Ketones, ur: 15 mg/dL — AB
Nitrite: NEGATIVE
pH: 6 (ref 5.0–8.0)

## 2011-04-08 LAB — POCT URINALYSIS DIP (DEVICE)
Protein, ur: 100 mg/dL — AB
Specific Gravity, Urine: 1.025 (ref 1.005–1.030)
Urobilinogen, UA: 1 mg/dL (ref 0.0–1.0)

## 2011-04-08 LAB — GLUCOSE, CAPILLARY
Glucose-Capillary: 103 mg/dL — ABNORMAL HIGH (ref 70–99)
Glucose-Capillary: 107 mg/dL — ABNORMAL HIGH (ref 70–99)
Glucose-Capillary: 112 mg/dL — ABNORMAL HIGH (ref 70–99)
Glucose-Capillary: 134 mg/dL — ABNORMAL HIGH (ref 70–99)
Glucose-Capillary: 69 mg/dL — ABNORMAL LOW (ref 70–99)
Glucose-Capillary: 72 mg/dL (ref 70–99)
Glucose-Capillary: 86 mg/dL (ref 70–99)
Glucose-Capillary: 90 mg/dL (ref 70–99)
Glucose-Capillary: 96 mg/dL (ref 70–99)

## 2011-04-08 LAB — VITAMIN B12: Vitamin B-12: 220 pg/mL (ref 211–911)

## 2011-04-08 LAB — LIPID PANEL: HDL: 58 mg/dL (ref >39)

## 2011-04-08 LAB — URINE CULTURE

## 2011-04-08 LAB — DIFFERENTIAL
Lymphocytes Relative: 14 % (ref 12–46)
Monocytes Absolute: 0.5 10*3/uL (ref 0.1–1.0)
Monocytes Relative: 7 % (ref 3–12)
Neutro Abs: 5.8 10*3/uL (ref 1.7–7.7)

## 2011-04-08 LAB — URINE MICROSCOPIC-ADD ON

## 2011-04-08 LAB — RPR

## 2011-04-08 LAB — RPR TITER: RPR Titer: 1:64 {titer} — AB

## 2011-04-08 LAB — AMYLASE: Amylase: 79 U/L (ref 27–131)

## 2011-04-26 ENCOUNTER — Inpatient Hospital Stay (HOSPITAL_COMMUNITY)
Admission: EM | Admit: 2011-04-26 | Discharge: 2011-04-28 | DRG: 195 | Disposition: A | Discharge status: Home Health | Payer: Medicare Other | Source: Ambulatory Visit | Attending: Internal Medicine | Admitting: Internal Medicine

## 2011-04-26 DIAGNOSIS — Z880 Allergy status to penicillin: Secondary | ICD-10-CM

## 2011-04-26 DIAGNOSIS — B9789 Other viral agents as the cause of diseases classified elsewhere: Secondary | ICD-10-CM | POA: Diagnosis present

## 2011-04-26 DIAGNOSIS — Z79899 Other long term (current) drug therapy: Secondary | ICD-10-CM

## 2011-04-26 DIAGNOSIS — I10 Essential (primary) hypertension: Secondary | ICD-10-CM | POA: Diagnosis present

## 2011-04-26 DIAGNOSIS — F0390 Unspecified dementia without behavioral disturbance: Secondary | ICD-10-CM | POA: Diagnosis present

## 2011-04-26 DIAGNOSIS — J189 Pneumonia, unspecified organism: Principal | ICD-10-CM | POA: Diagnosis present

## 2011-04-27 ENCOUNTER — Emergency Department (HOSPITAL_COMMUNITY): Payer: Medicare Other

## 2011-04-27 LAB — COMPREHENSIVE METABOLIC PANEL
Albumin: 3.6 g/dL (ref 3.5–5.2)
BUN: 37 mg/dL — ABNORMAL HIGH (ref 6–23)
Chloride: 102 mEq/L (ref 96–112)
Creatinine, Ser: 1.18 mg/dL — ABNORMAL HIGH (ref 0.50–1.10)
Total Bilirubin: 0.8 mg/dL (ref 0.3–1.2)

## 2011-04-27 LAB — POCT I-STAT TROPONIN I

## 2011-04-27 LAB — CBC
MCV: 90.8 fL (ref 78.0–100.0)
Platelets: 277 10*3/uL (ref 150–400)
RDW: 13.3 % (ref 11.5–15.5)
WBC: 13.1 10*3/uL — ABNORMAL HIGH (ref 4.0–10.5)

## 2011-04-27 LAB — URINALYSIS, ROUTINE W REFLEX MICROSCOPIC
Bilirubin Urine: NEGATIVE
Nitrite: NEGATIVE
Specific Gravity, Urine: 1.019 (ref 1.005–1.030)
Urobilinogen, UA: 1 mg/dL (ref 0.0–1.0)

## 2011-04-27 LAB — DIFFERENTIAL
Basophils Absolute: 0 10*3/uL (ref 0.0–0.1)
Basophils Relative: 0 % (ref 0–1)
Eosinophils Absolute: 0 10*3/uL (ref 0.0–0.7)
Eosinophils Relative: 0 % (ref 0–5)

## 2011-04-27 LAB — PROCALCITONIN: Procalcitonin: 0.21 ng/mL

## 2011-04-27 LAB — PROTIME-INR: Prothrombin Time: 16.1 seconds — ABNORMAL HIGH (ref 11.6–15.2)

## 2011-04-27 LAB — GLUCOSE, CAPILLARY: Glucose-Capillary: 139 mg/dL — ABNORMAL HIGH (ref 70–99)

## 2011-04-27 LAB — LACTIC ACID, PLASMA: Lactic Acid, Venous: 1.1 mmol/L (ref 0.5–2.2)

## 2011-04-28 LAB — COMPREHENSIVE METABOLIC PANEL
AST: 64 U/L — ABNORMAL HIGH (ref 0–37)
Albumin: 2.8 g/dL — ABNORMAL LOW (ref 3.5–5.2)
Calcium: 9.2 mg/dL (ref 8.4–10.5)
Chloride: 101 mEq/L (ref 96–112)
Creatinine, Ser: 1.16 mg/dL — ABNORMAL HIGH (ref 0.50–1.10)
Total Bilirubin: 0.7 mg/dL (ref 0.3–1.2)
Total Protein: 6.7 g/dL (ref 6.0–8.3)

## 2011-04-28 LAB — IRON AND TIBC
Iron: 17 ug/dL — ABNORMAL LOW (ref 42–135)
Saturation Ratios: 8 % — ABNORMAL LOW (ref 20–55)
TIBC: 208 ug/dL — ABNORMAL LOW (ref 250–470)

## 2011-04-28 LAB — CBC
MCHC: 34 g/dL (ref 30.0–36.0)
MCV: 90.3 fL (ref 78.0–100.0)
Platelets: 259 10*3/uL (ref 150–400)
RDW: 13.4 % (ref 11.5–15.5)
WBC: 11.2 10*3/uL — ABNORMAL HIGH (ref 4.0–10.5)

## 2011-04-28 LAB — LEGIONELLA ANTIGEN, URINE: Legionella Antigen, Urine: NEGATIVE

## 2011-04-28 LAB — URINE CULTURE
Colony Count: NO GROWTH
Culture  Setup Time: 201210240857

## 2011-04-28 NOTE — H&P (Signed)
NAMEMarland Kitchen  [name], [name] NO.:  [id number]  MEDICAL RECORD NO.:  [id number]  LOCATION:  [facility]                         FACILITY:  [facility]  PHYSICIAN:  Osvaldo Shipper, MD     DATE OF BIRTH:  10-02-1914  DATE OF ADMISSION:  04/26/2011 DATE OF DISCHARGE:                             HISTORY & PHYSICAL   PRIMARY CARE PHYSICIAN:  Renaye Rakers, MD  ADMISSION DIAGNOSES: 1. Left lower lobe pneumonia likely community acquired. 2. Possible viral syndrome. 3. Dementia. 4. History of hypertension.  CHIEF COMPLAINT:  Generalized body aches and fever.  HISTORY OF PRESENT ILLNESS:  The patient is a 77 year old African American female with a past medical history of dementia and hypertension who was in her usual state of health till Friday when she started having aches and pains in her back and sides and then she started complaining of shoulder pain, neck pain and jaw pain.  The patient apparently sits in her couch at a strange angle while watching TV and this per the granddaughter causes her to have some neck pain.  The daughter tried some icy hot patches on her neck and her jaw.  The patient also started having complaining of some headaches and so she decided to bring her to the hospital after she found that the patient had a fever of 101.2 degrees Fahrenheit.  Denies any skin rashes.  Denies any joint swelling. Two weeks ago, the granddaughter noted that the patient was sneezing, but that resolved on its own and then she went to a nursing home on Monday to visit somebody and she is concerned that she may have picked up something there.  She denies any shortness of breath per Se.  The patient when I asked her of any pain, she declined that she did not have any pain anywhere.  The granddaughter also noticed that the patient was having some dark urination over the last couple of days.  The patient is awake, alert, but does not want to answer my questions.  MEDICATIONS AT  HOME: 1. Lisinopril, hydrochlorothiazide unknown dose. 2. MiraLax as needed for constipation.  ALLERGIES:  PENICILLIN reaction is unknown.  PAST MEDICAL HISTORY:  Positive for arthritis, dementia, hypertension and history of a UTI.  SURGICAL HISTORY:  She has had some kind of abdominal surgery, but the granddaughter does not remember.  SOCIAL HISTORY:  The granddaughter lives with the patient.  There is a former history of smoking.  Former history of drinking, but none currently.  She uses a cane to ambulate.  FAMILY HISTORY:  Positive for some unknown cancers.  REVIEW OF SYSTEMS:  Unable to do because of patient's dementia.  PHYSICAL EXAMINATION:  VITAL SIGNS:  Temperature 100.8 and then subsequently 98.8, blood pressure 142/51, heart rate 71, respiratory rate 18 and saturation 100% on room air. GENERAL:  A thin, elderly female, in no distress. HEENT:  Head is normocephalic, atraumatic.  Pupils are equal, reacting. No pallor.  No icterus.  Oral mucous membrane is slightly dry.  No oral lesions are noted. She is able to move her neck left and right, although she does not want to move any part of her body.  When  I asked her to move her legs, she did not want to move them.  She did, however, lift her hands.  She was also complaining of some right jaw pain and there is no obvious lesion in the right jaw area.  LUNGS:  Decreased air entry at the bases without any wheezing or rhonchi. CARDIOVASCULAR:  S1 and S2 is normal and regular.  No S3-S4, no rubs, murmurs or bruit.  ABDOMEN:  Soft, nontender and nondistended.  Bowel sounds are present.  No masses or organomegaly is appreciated. GU:  Deferred. MUSCULOSKELETAL:  Normal muscle mass and tone. NEUROLOGICALLY:  She is alert, confused.  No focal neurological deficits are present.  LABORATORY DATA:  Her white cell count is 13.1 with essentially normal differential.  Hemoglobin is 11.6, MCV is 90 and platelet count is 277. INR is  1.26.  Electrolytes are normal.  Her BUN is elevated at 37, creatinine is 1.18.  Her alk phos is 121, AST is 74, ALT is 61, bilirubin is 0.8, albumin is 3.6, procalcitonin 0.21,  lactic acid 1.1, lipase was 14, troponin 0.04.  UA was unremarkable.  She had a chest x-ray which showed mild left basilar airspace opacity, possibly reflecting atelectasis or possibly mild pneumonia.  Superior subluxation of the left humeral heads likely reflects a remote rotator cuff injury.  CT of the head showed no acute intracranial process, some atrophy was seen.  Chronic encephalomalacia within the left parietal lobe and some other nonspecific findings.  CT of the cervical spine showed degenerative changes without any acute fractures.  EKG was done which shows sinus rhythm at 70.  Normal axis intervals appeared to be in the normal range.  No concerning ST or T-wave changes are noted.  No Q-waves are present.  ASSESSMENT:  This is a 77 year old African American female, who presents with body aches, fever.  This is most likely sounds like a viral syndrome.  She, however, has this abnormality seen on her chest x-ray, which could represent a pneumonic process.  PLAN: 1. Left lower lobe pneumonia, which is community acquired.  This will     be treated with ceftriaxone and azithromycin.  Blood counts will be     repeated in the morning. 2. Possible vital syndrome.  We will continue to monitor her fevers     closely including her white cell counts and symptomatic treatment     will be provided for now. 3. History of dementia, stable. 4. History of hypertension, stable.  We will try to obtain the correct     dosage of her home medications. 5. Code status was discussed with the granddaughter and she would like     the patient to be resuscitated and put on life support if needed. So she is a full code. 1. DVT prophylaxis will be initiated. 2. Further management decisions will depend on results of further      testing and patient's response to treatment.  Osvaldo Shipper, MD     GK/MEDQ  D:  04/27/2011  T:  04/27/2011  Job:  [id number]  cc:   Renaye Rakers, M.D.  Electronically Signed by Osvaldo Shipper MD on 04/28/2011 07:35:12 PM

## 2011-04-29 LAB — HEPATITIS PANEL, ACUTE: Hepatitis B Surface Ag: NEGATIVE

## 2011-05-03 LAB — CULTURE, BLOOD (ROUTINE X 2)
Culture  Setup Time: 201210240826
Culture: NO GROWTH

## 2011-05-03 NOTE — Discharge Summary (Signed)
NAMELAFERN, [name] NO.:  [id number]  MEDICAL RECORD NO.:  [id number]  LOCATION:  6735                         FACILITY:  [facility]  PHYSICIAN:  Peggye Pitt, M.D. DATE OF BIRTH:  09/22/1914  DATE OF ADMISSION:  04/26/2011 DATE OF DISCHARGE:  04/28/2011                              DISCHARGE SUMMARY   PRIMARY CARE PHYSICIAN:  Renaye Rakers, MD  DISCHARGE DIAGNOSES: 1. Left lower lobe community-acquired pneumonia. 2. Dementia. 3. Hypertension.  DISCHARGE MEDICATIONS: 1. Avelox 400 mg daily for 10 days. 2. Losartan 100/hydrochlorothiazide 25 mg 1 tablet daily. 3. MiraLax 17 g daily as needed for constipation.  DISPOSITION AND FOLLOWUP:  [name] will be discharged home today in stable and improved condition.  She has been afebrile here.  She was instructed to follow up with her primary care physician in 2-3 weeks.  I would recommend repeating a chest x-ray in 4-6 weeks to ensure complete resolution of her pneumonia.  CONSULTATIONS THIS HOSPITALIZATION:  None.  IMAGES AND PROCEDURES: 1. During this hospitalization include a chest x-ray on April 27, 2011, that showed mild left basilar opacity, likely representing     mild pneumonia.  There is a remote rotator cuff injury. 2. A CT scan of the head that showed no acute intracranial pathology     with moderate cortical volume loss and scattered small-vessel     ischemic microangiopathy.  There is chronic encephalomalacia within     the left parietal lobe, reflecting remote infarct. 3. A CT of the C-spine that showed no evidence of fracture or     subluxation along the cervical spine.  HISTORY AND PHYSICAL:  For full details, please refer to dictation on April 27, 2011, by Dr. Rito Ehrlich, however, in brief, [name] is a 77- year-old [place] American woman who has a history of dementia and hypertension, who presented to the hospital with about a 3-4 day history of shoulder and jaw pain, which the  granddaughter thinks may be positional.  However, she started having temperatures up to 101.2 and this concerned the granddaughter who brought her into the hospital.  On initial examination, she was found to have left lower lobe pneumonia and hence was admitted to our service for further evaluation and management.  HOSPITAL COURSE BY PROBLEM: 1. Left lower lobe community-acquired pneumonia.  In the hospital, she     was started on treatment with Rocephin and azithromycin.  I will     transition this over to Avelox at the time of discharge.  I have     recommended a 10-day course.  I have asked her granddaughter to     bring her back and to see her PCP, Dr. Parke Simmers, in 3-4 weeks.  I have     told them that they may use Tylenol as needed for temperatures     greater than 100.4, she has not been febrile while in the hospital.     She has been evaluated by PT and OT who were recommending Home     Health Services which will be arranged by care management prior to     her discharge. 2. Rest of chronic  conditions are stable.  VITALS ON THE DAY OF DISCHARGE:  VITAL SIGNS:  Blood pressure 116/75, heart rate 50, respirations 20, temperature of 98.4, and sats of 95% on room air.     Peggye Pitt, M.D.     EH/MEDQ  D:  04/28/2011  T:  04/28/2011  Job:  [id number]  cc:   Renaye Rakers, M.D.  Electronically Signed by Peggye Pitt M.D. on 05/03/2011 04:54:09 PM

## 2011-05-07 ENCOUNTER — Other Ambulatory Visit (HOSPITAL_COMMUNITY): Payer: Self-pay | Admitting: Family Medicine

## 2011-05-07 ENCOUNTER — Ambulatory Visit (HOSPITAL_COMMUNITY)
Admission: RE | Admit: 2011-05-07 | Discharge: 2011-05-07 | Disposition: A | Discharge status: Home / Self Care | Payer: Medicare Other | Source: Ambulatory Visit | Attending: Family Medicine | Admitting: Family Medicine

## 2011-05-07 DIAGNOSIS — M79609 Pain in unspecified limb: Secondary | ICD-10-CM | POA: Insufficient documentation

## 2011-05-07 DIAGNOSIS — R52 Pain, unspecified: Secondary | ICD-10-CM

## 2011-05-07 DIAGNOSIS — M899 Disorder of bone, unspecified: Secondary | ICD-10-CM | POA: Insufficient documentation

## 2011-05-07 DIAGNOSIS — M949 Disorder of cartilage, unspecified: Secondary | ICD-10-CM | POA: Insufficient documentation

## 2011-06-24 ENCOUNTER — Encounter (HOSPITAL_BASED_OUTPATIENT_CLINIC_OR_DEPARTMENT_OTHER): Payer: Medicare Other | Attending: General Surgery

## 2011-06-24 DIAGNOSIS — L89609 Pressure ulcer of unspecified heel, unspecified stage: Secondary | ICD-10-CM | POA: Insufficient documentation

## 2011-06-24 DIAGNOSIS — Z79899 Other long term (current) drug therapy: Secondary | ICD-10-CM | POA: Insufficient documentation

## 2011-06-24 DIAGNOSIS — L8990 Pressure ulcer of unspecified site, unspecified stage: Secondary | ICD-10-CM | POA: Insufficient documentation

## 2011-06-24 DIAGNOSIS — I10 Essential (primary) hypertension: Secondary | ICD-10-CM | POA: Insufficient documentation

## 2011-06-25 NOTE — Progress Notes (Signed)
[facility]  NAME:  [name], [name] NO.:  [id number]  MEDICAL RECORD NO.:  [id number]      DATE OF BIRTH:  April 09, 1915  PHYSICIAN:  Ardath Sax, M.D.           VISIT DATE:                                  OFFICE VISIT   This is a delightful 77 year old lady who comes to [place] for evaluation of her left heel.  She has what appears to be 3 cm in diameter pressure sore.  I debrided off dead skin and subcutaneous tissue down until I got to some good bleeding tissue, and then we were treated with Santyl and will have her come back in a week, and probably plan on using collagen and may be even Oasis in the future.  She is not diabetic and she is an alert 77 year old who ambulates with the use of a cane.  All of her blood work including her white count, hemoglobin, BUN, creatinine, electrolytes are all normal.  Her vital signs are normal.  She is really reasonably healthy lady for 77 years of age.  She has been placed on Cipro by her family doctor because of this wound, which when I 1st started working on it, was foul smelling.  She is on losartan, hydrochlorothiazide for hypertension and that is all.  I really think this wound will heal up with multiple debridements, the Santyl and collagen and maybe Oasis.     Ardath Sax, M.D.     PP/MEDQ  D:  06/24/2011  T:  06/25/2011  Job:  [id number]

## 2011-07-08 ENCOUNTER — Encounter (HOSPITAL_BASED_OUTPATIENT_CLINIC_OR_DEPARTMENT_OTHER): Payer: Medicare Other | Attending: General Surgery

## 2011-07-08 DIAGNOSIS — L8990 Pressure ulcer of unspecified site, unspecified stage: Secondary | ICD-10-CM | POA: Insufficient documentation

## 2011-07-08 DIAGNOSIS — Z79899 Other long term (current) drug therapy: Secondary | ICD-10-CM | POA: Insufficient documentation

## 2011-07-08 DIAGNOSIS — I10 Essential (primary) hypertension: Secondary | ICD-10-CM | POA: Insufficient documentation

## 2011-07-08 DIAGNOSIS — L89609 Pressure ulcer of unspecified heel, unspecified stage: Secondary | ICD-10-CM | POA: Insufficient documentation

## 2011-07-22 ENCOUNTER — Encounter (HOSPITAL_BASED_OUTPATIENT_CLINIC_OR_DEPARTMENT_OTHER): Payer: Medicare Other

## 2011-08-05 ENCOUNTER — Encounter (HOSPITAL_BASED_OUTPATIENT_CLINIC_OR_DEPARTMENT_OTHER): Payer: Medicare Other | Attending: General Surgery

## 2011-08-05 DIAGNOSIS — L89609 Pressure ulcer of unspecified heel, unspecified stage: Secondary | ICD-10-CM | POA: Insufficient documentation

## 2011-08-05 DIAGNOSIS — L84 Corns and callosities: Secondary | ICD-10-CM | POA: Insufficient documentation

## 2011-08-05 DIAGNOSIS — Z79899 Other long term (current) drug therapy: Secondary | ICD-10-CM | POA: Insufficient documentation

## 2011-08-05 DIAGNOSIS — L8992 Pressure ulcer of unspecified site, stage 2: Secondary | ICD-10-CM | POA: Insufficient documentation

## 2011-08-05 DIAGNOSIS — I10 Essential (primary) hypertension: Secondary | ICD-10-CM | POA: Insufficient documentation

## 2011-09-02 ENCOUNTER — Encounter (HOSPITAL_BASED_OUTPATIENT_CLINIC_OR_DEPARTMENT_OTHER): Payer: Medicare Other

## 2012-08-14 ENCOUNTER — Encounter (HOSPITAL_BASED_OUTPATIENT_CLINIC_OR_DEPARTMENT_OTHER): Payer: Medicare Other | Attending: General Surgery

## 2012-08-14 ENCOUNTER — Other Ambulatory Visit (HOSPITAL_BASED_OUTPATIENT_CLINIC_OR_DEPARTMENT_OTHER): Payer: Self-pay | Admitting: General Surgery

## 2012-08-14 ENCOUNTER — Ambulatory Visit (HOSPITAL_COMMUNITY)
Admission: RE | Admit: 2012-08-14 | Discharge: 2012-08-14 | Disposition: A | Discharge status: Home / Self Care | Payer: Medicare Other | Source: Ambulatory Visit | Attending: General Surgery | Admitting: General Surgery

## 2012-08-14 DIAGNOSIS — I10 Essential (primary) hypertension: Secondary | ICD-10-CM | POA: Insufficient documentation

## 2012-08-14 DIAGNOSIS — S8990XA Unspecified injury of unspecified lower leg, initial encounter: Secondary | ICD-10-CM | POA: Insufficient documentation

## 2012-08-14 DIAGNOSIS — M7730 Calcaneal spur, unspecified foot: Secondary | ICD-10-CM | POA: Insufficient documentation

## 2012-08-14 DIAGNOSIS — S99929A Unspecified injury of unspecified foot, initial encounter: Secondary | ICD-10-CM | POA: Insufficient documentation

## 2012-08-14 DIAGNOSIS — M79609 Pain in unspecified limb: Secondary | ICD-10-CM | POA: Insufficient documentation

## 2012-08-14 DIAGNOSIS — X58XXXA Exposure to other specified factors, initial encounter: Secondary | ICD-10-CM | POA: Insufficient documentation

## 2012-08-14 DIAGNOSIS — IMO0002 Reserved for concepts with insufficient information to code with codable children: Secondary | ICD-10-CM

## 2012-08-14 DIAGNOSIS — Z79899 Other long term (current) drug therapy: Secondary | ICD-10-CM | POA: Insufficient documentation

## 2012-08-14 DIAGNOSIS — L851 Acquired keratosis [keratoderma] palmaris et plantaris: Secondary | ICD-10-CM | POA: Insufficient documentation

## 2012-08-14 NOTE — H&P (Signed)
NAMEMADDUX, FIRST NO.:  [id number]  MEDICAL RECORD NO.:  [id number]  LOCATION:  FOOT                         FACILITY:  [facility]  PHYSICIAN:  Joanne Gavel, M.D.        DATE OF BIRTH:  [birth year]  DATE OF ADMISSION:  08/14/2012 DATE OF DISCHARGE:                             HISTORY & PHYSICAL   CHIEF COMPLAINT:  Pain, left heel.  HISTORY OF PRESENT ILLNESS:  This patient was healed of left heel ulcer a year and half ago.  She was complaining of some tenderness in the left heel and dryness of skin.  PAST MEDICAL HISTORY:  The patient is nondiabetic.  She has a history of hypertension, arthritis, and some dementia.  SOCIAL HISTORY:  Cigarettes none.  Alcohol none.  ALLERGIES:  Penicillin.  MEDICATIONS:  Hydrochlorothiazide and losartan.  REVIEW OF SYSTEMS:  As above.  PHYSICAL EXAMINATION:  VITAL SIGNS:  Temperature 97.9, pulse 50, respirations 18, blood pressure 114/64. GENERAL APPEARANCE:  Well developed, elderly, no distress. CHEST:  Clear. HEART:  Regular rhythm. EXTREMITIES:  Examination of the lower extremities reveals peripheral pulses are not palpable.  Skin is dry.  There are no wounds present right or left.  The daughter is concerned about some swelling in the ankles but this is basically perceptible.  There is very slight tenderness to heel to deep palpation.  IMPRESSION:  No open wounds.  I have ordered x-rays of the left heel particularly to see if there is a bone spur or some other abnormality and we will see her p.r.n.     Joanne Gavel, M.D.     RA/MEDQ  D:  08/14/2012  T:  08/14/2012  Job:  [id number]

## 2013-05-26 ENCOUNTER — Emergency Department (HOSPITAL_COMMUNITY): Payer: Medicare Other

## 2013-05-26 ENCOUNTER — Encounter (HOSPITAL_COMMUNITY): Payer: Self-pay | Admitting: Emergency Medicine

## 2013-05-26 ENCOUNTER — Emergency Department (HOSPITAL_COMMUNITY)
Admission: EM | Admit: 2013-05-26 | Discharge: 2013-05-26 | Disposition: A | Discharge status: Home / Self Care | Payer: Medicare Other | Attending: Emergency Medicine | Admitting: Emergency Medicine

## 2013-05-26 DIAGNOSIS — K5900 Constipation, unspecified: Secondary | ICD-10-CM | POA: Insufficient documentation

## 2013-05-26 DIAGNOSIS — N390 Urinary tract infection, site not specified: Secondary | ICD-10-CM

## 2013-05-26 DIAGNOSIS — R0989 Other specified symptoms and signs involving the circulatory and respiratory systems: Secondary | ICD-10-CM | POA: Insufficient documentation

## 2013-05-26 DIAGNOSIS — F0390 Unspecified dementia without behavioral disturbance: Secondary | ICD-10-CM | POA: Insufficient documentation

## 2013-05-26 DIAGNOSIS — J3489 Other specified disorders of nose and nasal sinuses: Secondary | ICD-10-CM | POA: Insufficient documentation

## 2013-05-26 DIAGNOSIS — J209 Acute bronchitis, unspecified: Secondary | ICD-10-CM | POA: Insufficient documentation

## 2013-05-26 DIAGNOSIS — Z880 Allergy status to penicillin: Secondary | ICD-10-CM | POA: Insufficient documentation

## 2013-05-26 DIAGNOSIS — R011 Cardiac murmur, unspecified: Secondary | ICD-10-CM | POA: Insufficient documentation

## 2013-05-26 DIAGNOSIS — R062 Wheezing: Secondary | ICD-10-CM | POA: Insufficient documentation

## 2013-05-26 DIAGNOSIS — J40 Bronchitis, not specified as acute or chronic: Secondary | ICD-10-CM

## 2013-05-26 LAB — CBC
HCT: 35.9 % — ABNORMAL LOW (ref 36.0–46.0)
Hemoglobin: 11.9 g/dL — ABNORMAL LOW (ref 12.0–15.0)
MCHC: 33.1 g/dL (ref 30.0–36.0)
MCV: 92.8 fL (ref 78.0–100.0)
WBC: 7.8 10*3/uL (ref 4.0–10.5)

## 2013-05-26 LAB — URINALYSIS, ROUTINE W REFLEX MICROSCOPIC
Glucose, UA: NEGATIVE mg/dL
Ketones, ur: NEGATIVE mg/dL
pH: 5.5 (ref 5.0–8.0)

## 2013-05-26 LAB — COMPREHENSIVE METABOLIC PANEL
ALT: 25 U/L (ref 0–35)
CO2: 19 mEq/L (ref 19–32)
Calcium: 9.2 mg/dL (ref 8.4–10.5)
Chloride: 106 mEq/L (ref 96–112)
Creatinine, Ser: 1.28 mg/dL — ABNORMAL HIGH (ref 0.50–1.10)
GFR calc Af Amer: 40 mL/min — ABNORMAL LOW (ref >90)
GFR calc non Af Amer: 34 mL/min — ABNORMAL LOW (ref >90)
Glucose, Bld: 176 mg/dL — ABNORMAL HIGH (ref 70–99)
Sodium: 137 mEq/L (ref 135–145)
Total Bilirubin: 0.4 mg/dL (ref 0.3–1.2)

## 2013-05-26 MED ORDER — LEVOFLOXACIN 750 MG PO TABS: 750.0000 mg | ORAL_TABLET | Freq: Every day | ORAL | Status: DC

## 2013-05-26 MED ORDER — ALBUTEROL SULFATE (5 MG/ML) 0.5% IN NEBU
5.0000 mg | INHALATION_SOLUTION | Freq: Once | RESPIRATORY_TRACT | Status: AC
  Administered 2013-05-26: 5 mg via RESPIRATORY_TRACT
  Filled 2013-05-26: qty 1

## 2013-05-26 MED ORDER — DEXTROSE 5 % IV SOLN
1.0000 g | Freq: Once | INTRAVENOUS | Status: AC
  Administered 2013-05-26: 1 g via INTRAVENOUS
  Filled 2013-05-26: qty 10

## 2013-05-26 MED ORDER — ACETAMINOPHEN 325 MG PO TABS
650.0000 mg | ORAL_TABLET | Freq: Once | ORAL | Status: AC
  Administered 2013-05-26: 650 mg via ORAL
  Filled 2013-05-26: qty 2

## 2013-05-26 NOTE — ED Provider Notes (Signed)
MSE was initiated and I personally evaluated the patient and placed orders (if any) at  2:36 PM on May 26, 2013.  The patient appears stable so that the remainder of the MSE may be completed by another provider. BP 142/42  Pulse 64  Resp 26  SpO2 99%  Daughter reports one week history of congestion and "allergies". Patient hospitalized for pneumonia last year. Denies fevers, chills, nausea or vomiting. No chest pain. Does not smoke. No history of COPD or asthma. On exam, no distress regular rate and rhythm with systolic murmur. Lungs clear with rhonchi at the bases. No lower extremity edema.  Glynn Octave, MD 05/26/13 [id number]

## 2013-05-26 NOTE — ED Provider Notes (Signed)
CSN: [id number]     Arrival date & time 05/26/13  1411 History   First MD Initiated Contact with Patient 05/26/13 1502     Chief Complaint  Patient presents with  . Shortness of Breath    wheezing   (Consider location/radiation/quality/duration/timing/severity/associated sxs/prior Treatment) HPI Comments: Patient is a 79 year old female with a clinical history of dementia and hypertension who presents to the emergency department with her granddaughter and grandson complaining of shortness of breath, congestion and "allergy type symptoms" times one week. Granddaughter states patient had a runny nose, productive cough with phlegm, occasional wheezing and "puffiness" around her eyes. Denies fever, chills, nausea, vomiting, urinary or bowel changes. It is noted that she has a temperature of 11.7 and the emergency department. Granddaughter has been getting Mucinex and Robitussin with mild relief. No sick contacts. Non smoker, no hx of COPD or asthma. She lives at home with her granddaughter. At baseline mentation per daughter, has been acting normal, eating well.  Patient is a 79 y.o. female presenting with shortness of breath. The history is provided by the patient and a relative.  Shortness of Breath Associated symptoms: cough and wheezing     Past Medical History  Diagnosis Date  . Dementia   . Chronic constipation   . S/P appy   . Hypertension   . S/P hysterectomy    History reviewed. No pertinent past surgical history. History reviewed. No pertinent family history. History  Substance Use Topics  . Smoking status: Never Smoker   . Smokeless tobacco: Never Used  . Alcohol Use: No   OB History   Grav Para Term Preterm Abortions TAB SAB Ect Mult Living                 Review of Systems  HENT: Positive for congestion.   Respiratory: Positive for cough, shortness of breath and wheezing.   All other systems reviewed and are negative.    Allergies  Penicillins  Home  Medications  No current outpatient prescriptions on file. BP 142/42  Pulse 64  Temp(Src) 101.7 F (38.7 C) (Rectal)  Resp 26  SpO2 99% Physical Exam  Nursing note and vitals reviewed. Constitutional: She is oriented to person, place, and time. She appears well-developed and well-nourished. No distress.  HENT:  Head: Normocephalic and atraumatic.  Mouth/Throat: Oropharynx is clear and moist.  Congestion, nasal mucosal edema.  Eyes: Conjunctivae and EOM are normal. Pupils are equal, round, and reactive to light.  Neck: Normal range of motion. Neck supple.  Cardiovascular: Normal rate, regular rhythm and intact distal pulses.   Murmur heard. No extremity edema.  Pulmonary/Chest: Effort normal. No respiratory distress. She has no decreased breath sounds. She has no wheezes. She has rhonchi (bilateral lung bases). She has no rales.  Abdominal: Soft. Bowel sounds are normal. She exhibits no distension. There is no tenderness.  Musculoskeletal: Normal range of motion. She exhibits no edema.  Neurological: She is alert and oriented to person, place, and time.  Skin: Skin is warm and dry. She is not diaphoretic.  Psychiatric: She has a normal mood and affect. Her behavior is normal.    ED Course  Procedures (including critical care time) Labs Review Labs Reviewed  COMPREHENSIVE METABOLIC PANEL  TROPONIN I  PRO B NATRIURETIC PEPTIDE  URINALYSIS, ROUTINE W REFLEX MICROSCOPIC   Imaging Review Dg Chest 2 View  05/26/2013   CLINICAL DATA:  Shortness of breath, fever, cough.  EXAM: CHEST  2 VIEW  COMPARISON:  04/27/2011  FINDINGS: Shallow lung volumes. The cardiac silhouette is within normal limits. There is mild diffuse prominence of interstitial markings. No focal regions consolidation or focal infiltrates. Visualized bony skeleton demonstrates superior subluxation of the humeral heads left greater than right.  IMPRESSION: 1. Pulmonary vascular congestion 2. Findings concern for bilateral  rotator cuff injury. 3. No focal regions of consolidation or focal infiltrates.   Electronically Signed   By: Salome Holmes M.D.   On: 05/26/2013 15:15    EKG Interpretation    Date/Time:  Sunday May 26 2013 14:44:53 EST Ventricular Rate:  61 PR Interval:  136 QRS Duration: 70 QT Interval:  439 QTC Calculation: 442 R Axis:   47 Text Interpretation:  Sinus rhythm Borderline repolarization abnormality No significant change was found Confirmed by Manus Gunning  MD, STEPHEN ([id number]) on 05/26/2013 2:47:08 PM            MDM   1. UTI (urinary tract infection)   2. Bronchitis     Pt with congestion, cough, fever. She is well appearing and in NAD, acting normal per granddaughter. Labs obtained after pt had MSE, cbc, cmp, ua, troponin, bnp, blood culture, urine culture- pending. CXR showing pulmonary vascular congestion, no focal consolidation or infiltrate. EKG without acute findings. Will give neb tx. 5:52 PM Urine positive for infection. Vitals remained stable, rectal temperature 99.4. She is not septic. Well-appearing. Family feels comfortable taking her home and being treated as an outpatient. She was given 1 g IV Rocephin in the emergency department, will be discharged with Levaquin for both urinary tract infection and bronchitis. Followup with PCP. Return precautions given. Case discussed with attending Dr. Lynelle Doctor who also evaluated patient and agrees with plan of care.   Trevor Mace, PA-C 05/26/13 1753  Trevor Mace, PA-C 05/26/13 1810

## 2013-05-26 NOTE — ED Provider Notes (Signed)
See prior note   Ward Givens, MD 05/26/13 [phone]

## 2013-05-26 NOTE — ED Notes (Signed)
Patient transported to X-ray 

## 2013-05-26 NOTE — ED Notes (Signed)
Attempted to start an IV in the right hand. IV in and patient jerked. IV infiltrated when trying to flush. Patient's grand daughter asked for a small vein specialist. Charge nurse notified and will attempt to start IV.

## 2013-05-26 NOTE — ED Provider Notes (Addendum)
Patient presents emergency Department with her granddaughter who is her primary caretaker. She states the patient started wheezing last week and then started getting chest congestion. She states she has had fever up to 101. She states patient however has had a good appetite, however she does chop up her solid food.. She reports last time she had wheezing was a year ago in October when she had complaints of headache and was found to have a pneumonia.  Patient is alert and awake. She does not answer questions. Her skin is hot to touch. On lung exam shows diminished breath sounds and scattered rhonchi. She has a prominent systolic murmer.  Medical screening examination/treatment/procedure(s) were conducted as a shared visit with non-physician practitioner(s) and myself.  I personally evaluated the patient during the encounter.  EKG Interpretation    Date/Time:  Sunday May 26 2013 14:44:53 EST Ventricular Rate:  61 PR Interval:  136 QRS Duration: 70 QT Interval:  439 QTC Calculation: 442 R Axis:   47 Text Interpretation:  Sinus rhythm Borderline repolarization abnormality No significant change was found Confirmed by Manus Gunning  MD, STEPHEN ([id number]) on 05/26/2013 2:47:08 PM             Devoria Albe, MD, Franz Dell, MD 05/26/13 [id number]  Ward Givens, MD 05/26/13 1810

## 2013-05-26 NOTE — ED Notes (Signed)
Pt had shortness of breath and wheezing

## 2013-05-28 LAB — URINE CULTURE

## 2013-05-29 ENCOUNTER — Telehealth (HOSPITAL_BASED_OUTPATIENT_CLINIC_OR_DEPARTMENT_OTHER): Payer: Self-pay | Admitting: *Deleted

## 2013-05-29 NOTE — Telephone Encounter (Signed)
Pt positive for >100,000 colonies of E. Coli in urine culture and no further treatment was recommended by pharmacy Pt was treated with Levofloxacin and bacteria was sensitive of the same.

## 2013-06-01 LAB — CULTURE, BLOOD (ROUTINE X 2)
Culture: NO GROWTH
Culture: NO GROWTH

## 2013-06-10 ENCOUNTER — Inpatient Hospital Stay (HOSPITAL_COMMUNITY)
Admission: EM | Admit: 2013-06-10 | Discharge: 2013-07-04 | DRG: 871 | Disposition: E | Payer: Medicare Other | Attending: Internal Medicine | Admitting: Internal Medicine

## 2013-06-10 ENCOUNTER — Emergency Department (HOSPITAL_COMMUNITY): Payer: Medicare Other

## 2013-06-10 ENCOUNTER — Encounter (HOSPITAL_COMMUNITY): Payer: Self-pay | Admitting: Emergency Medicine

## 2013-06-10 DIAGNOSIS — Z515 Encounter for palliative care: Secondary | ICD-10-CM

## 2013-06-10 DIAGNOSIS — E872 Acidosis, unspecified: Secondary | ICD-10-CM | POA: Diagnosis present

## 2013-06-10 DIAGNOSIS — E875 Hyperkalemia: Secondary | ICD-10-CM | POA: Diagnosis present

## 2013-06-10 DIAGNOSIS — T68XXXA Hypothermia, initial encounter: Secondary | ICD-10-CM

## 2013-06-10 DIAGNOSIS — E870 Hyperosmolality and hypernatremia: Secondary | ICD-10-CM | POA: Diagnosis present

## 2013-06-10 DIAGNOSIS — I10 Essential (primary) hypertension: Secondary | ICD-10-CM | POA: Diagnosis present

## 2013-06-10 DIAGNOSIS — N19 Unspecified kidney failure: Secondary | ICD-10-CM

## 2013-06-10 DIAGNOSIS — R4182 Altered mental status, unspecified: Secondary | ICD-10-CM

## 2013-06-10 DIAGNOSIS — K5900 Constipation, unspecified: Secondary | ICD-10-CM | POA: Diagnosis present

## 2013-06-10 DIAGNOSIS — G9340 Encephalopathy, unspecified: Secondary | ICD-10-CM

## 2013-06-10 DIAGNOSIS — I959 Hypotension, unspecified: Secondary | ICD-10-CM

## 2013-06-10 DIAGNOSIS — N179 Acute kidney failure, unspecified: Secondary | ICD-10-CM

## 2013-06-10 DIAGNOSIS — R570 Cardiogenic shock: Secondary | ICD-10-CM

## 2013-06-10 DIAGNOSIS — F0390 Unspecified dementia without behavioral disturbance: Secondary | ICD-10-CM | POA: Diagnosis present

## 2013-06-10 DIAGNOSIS — Z66 Do not resuscitate: Secondary | ICD-10-CM | POA: Diagnosis present

## 2013-06-10 DIAGNOSIS — Z87440 Personal history of urinary (tract) infections: Secondary | ICD-10-CM

## 2013-06-10 DIAGNOSIS — I214 Non-ST elevation (NSTEMI) myocardial infarction: Secondary | ICD-10-CM

## 2013-06-10 DIAGNOSIS — A419 Sepsis, unspecified organism: Principal | ICD-10-CM

## 2013-06-10 DIAGNOSIS — R748 Abnormal levels of other serum enzymes: Secondary | ICD-10-CM | POA: Diagnosis present

## 2013-06-10 LAB — COMPREHENSIVE METABOLIC PANEL
AST: 47 U/L — ABNORMAL HIGH (ref 0–37)
Albumin: 3.8 g/dL (ref 3.5–5.2)
BUN: 180 mg/dL — ABNORMAL HIGH (ref 6–23)
Calcium: 9.9 mg/dL (ref 8.4–10.5)
Chloride: 118 mEq/L — ABNORMAL HIGH (ref 96–112)
Creatinine, Ser: 8.06 mg/dL — ABNORMAL HIGH (ref 0.50–1.10)
Sodium: 161 mEq/L (ref 135–145)
Total Bilirubin: 0.8 mg/dL (ref 0.3–1.2)
Total Protein: 8.2 g/dL (ref 6.0–8.3)

## 2013-06-10 LAB — CBC WITH DIFFERENTIAL/PLATELET
Basophils Absolute: 0 10*3/uL (ref 0.0–0.1)
Basophils Relative: 0 % (ref 0–1)
Eosinophils Absolute: 0 10*3/uL (ref 0.0–0.7)
Eosinophils Relative: 0 % (ref 0–5)
HCT: 44.8 % (ref 36.0–46.0)
Hemoglobin: 15.1 g/dL — ABNORMAL HIGH (ref 12.0–15.0)
MCH: 31.7 pg (ref 26.0–34.0)
MCHC: 33.7 g/dL (ref 30.0–36.0)
Monocytes Absolute: 1.1 10*3/uL — ABNORMAL HIGH (ref 0.1–1.0)
Monocytes Relative: 7 % (ref 3–12)
Platelets: 175 10*3/uL (ref 150–400)

## 2013-06-10 LAB — TROPONIN I: Troponin I: 0.72 ng/mL (ref <0.30)

## 2013-06-10 LAB — LACTIC ACID, PLASMA: Lactic Acid, Venous: 6.3 mmol/L — ABNORMAL HIGH (ref 0.5–2.2)

## 2013-06-10 MED ORDER — SODIUM CHLORIDE 0.9 % IV BOLUS (SEPSIS)
500.0000 mL | INTRAVENOUS | Status: AC
  Administered 2013-06-10: 500 mL via INTRAVENOUS

## 2013-06-10 MED ORDER — LORAZEPAM 2 MG/ML IJ SOLN
2.0000 mg | INTRAMUSCULAR | Status: DC | PRN
  Administered 2013-06-11: 0.5 mg via INTRAVENOUS
  Filled 2013-06-10: qty 1

## 2013-06-10 MED ORDER — SODIUM CHLORIDE 0.9 % IV BOLUS (SEPSIS)
1000.0000 mL | INTRAVENOUS | Status: AC
  Administered 2013-06-10: 1000 mL via INTRAVENOUS

## 2013-06-10 MED ORDER — SCOPOLAMINE 1 MG/3DAYS TD PT72
1.0000 | MEDICATED_PATCH | TRANSDERMAL | Status: DC
  Administered 2013-06-10: 1.5 mg via TRANSDERMAL
  Filled 2013-06-10 (×4): qty 1

## 2013-06-10 MED ORDER — DEXTROSE 5 % IV SOLN: 2.0000 g | Freq: Two times a day (BID) | INTRAVENOUS | Status: DC

## 2013-06-10 MED ORDER — SODIUM CHLORIDE 0.9 % IV SOLN
1.0000 mg/h | INTRAVENOUS | Status: DC
  Administered 2013-06-10 – 2013-06-11 (×2): 1 mg/h via INTRAVENOUS
  Filled 2013-06-10 (×3): qty 10

## 2013-06-10 MED ORDER — SODIUM CHLORIDE 0.9 % IV BOLUS (SEPSIS): 1000.0000 mL | INTRAVENOUS | Status: DC

## 2013-06-10 MED ORDER — VANCOMYCIN HCL IN DEXTROSE 1-5 GM/200ML-% IV SOLN: 1000.0000 mg | Freq: Once | INTRAVENOUS | Status: DC

## 2013-06-10 MED ORDER — VANCOMYCIN HCL 1000 MG IV SOLR: 15.0000 mg/kg | INTRAVENOUS | Status: DC

## 2013-06-10 NOTE — Progress Notes (Signed)
Chaplain responded to request from  nurse/physician to visit pt.  Pt's granddaughter and another family member were also present.  Chaplain delivered compassionate care through conversation, empathic listening and prayer.  Pt's family expressed thanks for the chaplain's visit.    06-19-13 1700  Clinical Encounter Type  Visited With Patient and family together  Visit Type Initial;Spiritual support;ED  Referral From Nurse  Spiritual Encounters  Spiritual Needs Prayer;Emotional  Stress Factors  Family Stress Factors Loss;Health changes;Major life changes    Rulon Abide, chaplain, [phone]

## 2013-06-10 NOTE — ED Notes (Addendum)
Pt placed on NRM at 15 liters. O2 sat reading in upper 60s. Dr. Redgie Grayer was informed, will let Dr. Romeo Apple know as soon as he is found. Pt's RR 22, no acute distress noted. Pt cool to touch.

## 2013-06-10 NOTE — ED Notes (Signed)
Family at bedside is trying to decide if they want to be aggressive or proceed with comfort care.

## 2013-06-10 NOTE — ED Notes (Signed)
Portable xray at bedside.

## 2013-06-10 NOTE — ED Provider Notes (Signed)
CSN: [id number]     Arrival date & time 06-Jul-2013  1435 History   First MD Initiated Contact with Patient 07/06/13 1459     No chief complaint on file.  (Consider location/radiation/quality/duration/timing/severity/associated sxs/prior Treatment) Patient is a 79 y.o. female presenting with neurologic complaint. The history is provided by a caregiver.  Neurologic Problem This is a new problem. The current episode started more than 2 days ago. The problem occurs constantly. The problem has been gradually worsening. Pertinent negatives include no chest pain, no abdominal pain, no headaches and no shortness of breath. Exacerbated by: possibly after starting the erythromycin 4 days ago. Nothing relieves the symptoms. She has tried nothing for the symptoms. The treatment provided no relief.    Past Medical History  Diagnosis Date  . Dementia   . Chronic constipation   . S/P appy   . Hypertension   . S/P hysterectomy    History reviewed. No pertinent past surgical history. No family history on file. History  Substance Use Topics  . Smoking status: Never Smoker   . Smokeless tobacco: Never Used  . Alcohol Use: No   OB History   Grav Para Term Preterm Abortions TAB SAB Ect Mult Living                 Review of Systems  Constitutional: Negative for fever and fatigue.  HENT: Negative for congestion and drooling.   Eyes: Negative for pain.  Respiratory: Positive for cough. Negative for shortness of breath.   Cardiovascular: Negative for chest pain.  Gastrointestinal: Negative for nausea, vomiting, abdominal pain and diarrhea.  Genitourinary: Negative for dysuria and hematuria.  Musculoskeletal: Negative for back pain, gait problem and neck pain.  Skin: Negative for color change.  Neurological: Negative for dizziness and headaches.  Hematological: Negative for adenopathy.  Psychiatric/Behavioral: Negative for behavioral problems.  All other systems reviewed and are  negative.    Allergies  Penicillins  Home Medications   Current Outpatient Rx  Name  Route  Sig  Dispense  Refill  . guaiFENesin (MUCINEX) 600 MG 12 hr tablet   Oral   Take 600 mg by mouth 2 (two) times daily.         Marland Kitchen guaifenesin (ROBITUSSIN) 100 MG/5ML syrup   Oral   Take 200 mg by mouth 3 (three) times daily as needed for cough.         Marland Kitchen levofloxacin (LEVAQUIN) 750 MG tablet   Oral   Take 1 tablet (750 mg total) by mouth daily. X 7 days   7 tablet   0   . losartan-hydrochlorothiazide (HYZAAR) 100-25 MG per tablet   Oral   Take 1 tablet by mouth every evening.          BP 119/60  Temp(Src) 96.2 F (35.7 C) (Rectal)  Resp 26 Physical Exam  Nursing note and vitals reviewed. Constitutional: She appears well-developed and well-nourished.  HENT:  Head: Normocephalic.  Mouth/Throat: Oropharynx is clear and moist. No oropharyngeal exudate.  Eyes: Conjunctivae and EOM are normal. Pupils are equal, round, and reactive to light.  Neck: Normal range of motion. Neck supple.  Cardiovascular: Normal rate, regular rhythm, normal heart sounds and intact distal pulses.  Exam reveals no gallop and no friction rub.   No murmur heard. Pulmonary/Chest: Breath sounds normal. No respiratory distress. She has no wheezes.  Mild tachypnea.   Abdominal: Soft. Bowel sounds are normal. There is no tenderness. There is no rebound and no guarding.  Musculoskeletal: Normal  range of motion. She exhibits no edema and no tenderness.  Mild old abrasion to right medial knee.   Mild old abrasion to left lateral shin.    Neurological: She is alert.  Pt opened her eyes briefly for me on exam. Will not follow commands otherwise.   Skin: Skin is warm and dry.  Psychiatric: She has a normal mood and affect. Her behavior is normal.    ED Course  Procedures (including critical care time) Labs Review Labs Reviewed  CBC WITH DIFFERENTIAL - Abnormal; Notable for the following:    WBC 16.5  (*)    Hemoglobin 15.1 (*)    Neutrophils Relative % 85 (*)    Neutro Abs 13.9 (*)    Lymphocytes Relative 8 (*)    Monocytes Absolute 1.1 (*)    All other components within normal limits  COMPREHENSIVE METABOLIC PANEL - Abnormal; Notable for the following:    Sodium 161 (*)    Potassium 5.3 (*)    Chloride 118 (*)    CO2 16 (*)    Glucose, Bld 132 (*)    BUN 180 (*)    Creatinine, Ser 8.06 (*)    AST 47 (*)    GFR calc non Af Amer 4 (*)    GFR calc Af Amer 4 (*)    All other components within normal limits  TROPONIN I - Abnormal; Notable for the following:    Troponin I 0.72 (*)    All other components within normal limits  LACTIC ACID, PLASMA - Abnormal; Notable for the following:    Lactic Acid, Venous 6.3 (*)    All other components within normal limits   Imaging Review Dg Chest Portable 1 View  06/11/2013   CLINICAL DATA:  Short of breath.  EXAM: PORTABLE CHEST - 1 VIEW  COMPARISON:  05/26/2013.  FINDINGS: Cardiopericardial silhouette within normal limits. Mediastinal contours normal. Trachea midline. No airspace disease or effusion. Monitoring leads project over the chest. Bilateral high-riding humeral head compatible with chronic rotator cuff tears. Aortic arch atherosclerosis.  IMPRESSION: No acute cardiopulmonary disease.   Electronically Signed   By: Andreas Newport M.D.   On: 06/20/2013 16:06    EKG Interpretation    Date/Time:  Monday June 10 2013 14:44:01 EST Ventricular Rate:  67 PR Interval:  121 QRS Duration: 74 QT Interval:  590 QTC Calculation: 623 R Axis:   56 Text Interpretation:  Sinus rhythm Abnormal R-wave progression, early transition Probable LVH with secondary repol abnrm Prolonged QT interval T wave depressions in inferior and anterolateral leads Confirmed by Nial Hawe  MD, Jairus Tonne ([id number]) on 06/23/2013 4:04:27 PM           CRITICAL CARE Performed by: Purvis Sheffield, S Total critical care time: 1 hour Critical care time was exclusive  of separately billable procedures and treating other patients. Critical care was necessary to treat or prevent imminent or life-threatening deterioration. Critical care was time spent personally by me on the following activities: development of treatment plan with patient and/or surrogate as well as nursing, discussions with consultants, evaluation of patient's response to treatment, examination of patient, obtaining history from patient or surrogate, ordering and performing treatments and interventions, ordering and review of laboratory studies, ordering and review of radiographic studies, pulse oximetry and re-evaluation of patient's condition.   MDM   1. Renal failure   2. Altered mental status   3. Hypernatremia   4. Hyperkalemia   5. Hypotension   6. Cardiogenic shock  7. Acute renal failure   8. Dementia   9. Encephalopathy   10. Hypothermia, initial encounter   11. Lactic acidosis   12. NSTEMI (non-ST elevated myocardial infarction)   13. Severe sepsis with septic shock    3:12 PM 79 y.o. female w hx of dementia s/p levaquin for UTI/bronchitis now on erythromycin for ongoing bronchitis who pw dec po intake and responsiveness for the last few days. Home nursing noted low BP at home today and dec level of alertness. Family notes that pt does have dementia and has good and bad days w/ her level of alertness. She is AF and VS stable on my initial exam although pulse oximeter not picking up O2 sat. She will not follow commands for me on exam. Will get screening labs, 500cc IVF.   Pt's BP declining on exam (90/40 and then 70/30), O2 sat now picking up and jumping from 60's-70's. Ecg concerning for ischemia w/ elevated troponin. Labs also showing elevated lactic acid, hypernatremia, hyperkalemia and renal failure. Possibly cardiogenic shock given likely NSTEMI. The pt required very close monitoring at this time while I continued to evaluate the pt and had multiple discussions about options  with the family. The family decided to make the pt DNR and comfort care. I consulted the hospitalist for admission.   4:54 PM DNR signed. Pt will be comfort care.   Junius Argyle, MD 06/11/13 [phone]

## 2013-06-10 NOTE — ED Notes (Signed)
RT at bedside.

## 2013-06-10 NOTE — Progress Notes (Signed)
Pt family has several concerns about her diagnoses and outcome and is in need of a palliative care consult. Information on Cardiogenic shock was given to family. Will continue to monitor and assist Ilean Skill LPN

## 2013-06-10 NOTE — ED Notes (Signed)
Pt's granddaughter and caretaker at bedside - on 23rd pt was wheezing and went to [facility] told she had a UTI and bronchitis, sent pt home with meds. On Thursday they went to see PCP and was given another prescription for another antibiotic. Then a home health RN came Friday then another one came today, he was unable to get a BP and told family she looked very dehydrated and should go to hospital so family brought her in. Upon arrival pt unresponsive, not speaking to staff, responding to painful stimuli. Family reports that she is normally more alert and speaking to them. The past couple of days she hasn't done much walking but has been able to get up to use the bathroom.

## 2013-06-10 NOTE — H&P (Signed)
[facility] History and Physical  [name] NWG:[id number] DOB: 1914/09/09 DOA: 06/21/2013  Referring physician:  PCP: Geraldo Pitter, MD  Specialists:   Chief Complaint: Altered mental status  HPI: [name] is a 79 y.o. female  With a history of dementia, hypertension, recently treated bronchitis and UTI that presents emergency department with family for altered mental status. Information the sister disc was prepped and centered by the granddaughter. Patient does have a history of underlying dementia however recently her mental status started to wax and wane more than usual. Patient was noted to have bronchitis back in November of 2014 which she was treated for with Levaquin as well as a UTI. She did present to her primary care physician's office approximately one week after her hospitalization was found to continue having bronchitis and upper respiratory infection with some treated with erythromycin. Patient patient has not recently complained of any shortness of breath, headaches, abdominal pain, chest pain.  Review of Systems:  Could not be obtained from patient.  Past Medical History  Diagnosis Date  . Dementia   . Chronic constipation   . S/P appy   . Hypertension   . S/P hysterectomy    History reviewed. No pertinent past surgical history. Social History:  reports that she has never smoked. She has never used smokeless tobacco. She reports that she does not drink alcohol or use illicit drugs.   Allergies  Allergen Reactions  . Penicillins Other (See Comments)    Unknown    No family history on file.   Prior to Admission medications   Medication Sig Start Date End Date Taking? Authorizing Provider  azithromycin (ZITHROMAX) 200 MG/5ML suspension Take 100-200 mg by mouth daily. Takes 200mg  on Day 1, then takes 100mg  daily on Days 2 through 6. Today would be Day 5 of regimen.   Yes Historical Provider, MD  losartan-hydrochlorothiazide (HYZAAR) 100-25 MG per  tablet Take 1 tablet by mouth every evening.   Yes Historical Provider, MD  mupirocin ointment (BACTROBAN) 2 % Place 1 application into the nose 2 (two) times daily. Applies to blisters.   Yes Historical Provider, MD   Physical Exam: Filed Vitals:   06/28/2013 1700  BP:   Pulse:   Temp:   Resp: 20     General: Well developed, well nourished, NAD, appears stated age  HEENT: NCAT, Anicteic Sclera, mucous membranes moist. Neck: Supple, no JVD, no masses  Cardiovascular: S1 S2 auscultated,  Regular rate and rhythm.  Respiratory: Clear to auscultation bilaterally with equal chest rise  Abdomen: Soft, nontender, nondistended, + bowel sounds  Extremities: warm dry without cyanosis clubbing or edema.    Neuro: Unable to assess at this time due to patient's current altered mental status and state  Skin: Without rashes exudates or nodules, abrasion noted to right middle heel knee, abrasion and the left lateral shin  Labs on Admission:  Basic Metabolic Panel:  Recent Labs Lab 06/06/2013 1511  NA 161*  K 5.3*  CL 118*  CO2 16*  GLUCOSE 132*  BUN 180*  CREATININE 8.06*  CALCIUM 9.9   Liver Function Tests:  Recent Labs Lab 06/24/2013 1511  AST 47*  ALT 29  ALKPHOS 85  BILITOT 0.8  PROT 8.2  ALBUMIN 3.8   No results found for this basename: LIPASE, AMYLASE,  in the last 168 hours No results found for this basename: AMMONIA,  in the last 168 hours CBC:  Recent Labs Lab 06/08/2013 1511  WBC 16.5*  NEUTROABS  13.9*  HGB 15.1*  HCT 44.8  MCV 93.9  PLT 175   Cardiac Enzymes:  Recent Labs Lab 06/16/2013 1511  TROPONINI 0.72*    BNP (last 3 results)  Recent Labs  05/26/13 1530  PROBNP 1103.0*   CBG: No results found for this basename: GLUCAP,  in the last 168 hours  Radiological Exams on Admission: Dg Chest Portable 1 View  07/02/2013   CLINICAL DATA:  Short of breath.  EXAM: PORTABLE CHEST - 1 VIEW  COMPARISON:  05/26/2013.  FINDINGS: Cardiopericardial  silhouette within normal limits. Mediastinal contours normal. Trachea midline. No airspace disease or effusion. Monitoring leads project over the chest. Bilateral high-riding humeral head compatible with chronic rotator cuff tears. Aortic arch atherosclerosis.  IMPRESSION: No acute cardiopulmonary disease.   Electronically Signed   By: Andreas Newport M.D.   On: 06/28/2013 16:06    EKG: Independently reviewed. Sinus rhythm, rate 67, diffuse T wave abnormalities, LVH, marked change from prior EKGs  Assessment/Plan Active Problems:   Cardiogenic shock   Severe sepsis with septic shock  Severe sepsis with septic and cardiogenic shock Patient will be admitted to medical floor. Discussed with the patient's family CODE STATUS. Patient's family did agree to DO NOT RESUSCITATE as well as comfort measures. Patient will be admitted and placed on a morphine drip for comfort as well as Ativan and scopolamine patch. She was placed on supplemental oxygen. Patient is also currently on a bear hugger due to her hyponatremia.  Acute renal failure with hypernatremia and hyperkalemia Plan as above  Elevated troponin with ischemic changes noted on EKG Plan as above  Lactic acidosis Plan as above  DVT prophylaxis: None  Code Status: DNR/DNI, comfort care  Condition: critical   Family Communication: Granddaughter at bedside. Admission, patients condition and plan of care including tests being ordered have been discussed with the granddaughter who indicates understanding and agrees with the plan and Code Status.  Disposition Plan: admitted  Time spent: 45 minutes  Zyad Boomer D.O. [facility] Pager [phone]  If 7PM-7AM, please contact night-coverage [email or web address] Password [facility] 06/22/2013, 5:19 PM

## 2013-06-10 NOTE — ED Notes (Signed)
Dr Harrison at bedside

## 2013-06-10 NOTE — ED Notes (Signed)
Family denies recent falls. Denies taking bld thinners.

## 2013-06-11 DIAGNOSIS — G9340 Encephalopathy, unspecified: Secondary | ICD-10-CM

## 2013-06-11 DIAGNOSIS — E875 Hyperkalemia: Secondary | ICD-10-CM

## 2013-06-11 DIAGNOSIS — T68XXXA Hypothermia, initial encounter: Secondary | ICD-10-CM

## 2013-06-11 DIAGNOSIS — E872 Acidosis: Secondary | ICD-10-CM

## 2013-06-11 DIAGNOSIS — E870 Hyperosmolality and hypernatremia: Secondary | ICD-10-CM

## 2013-06-11 DIAGNOSIS — I214 Non-ST elevation (NSTEMI) myocardial infarction: Secondary | ICD-10-CM

## 2013-06-11 DIAGNOSIS — A419 Sepsis, unspecified organism: Secondary | ICD-10-CM

## 2013-06-11 DIAGNOSIS — I959 Hypotension, unspecified: Secondary | ICD-10-CM

## 2013-06-11 MED ORDER — LORAZEPAM 2 MG/ML IJ SOLN: 0.5000 mg | INTRAMUSCULAR | Status: DC | PRN

## 2013-06-11 NOTE — Progress Notes (Signed)
Nutrition Brief Note  RD drawn to chart due to Malnutrition Screening Tool. Chart reviewed.  RD notes, pt now transitioning to comfort care.  No further nutrition interventions warranted at this time.  Please re-consult as needed.   Loyce Dys, MS RD LDN Clinical Inpatient Dietitian Pager: [phone] Weekend/After hours pager: [phone]

## 2013-06-11 NOTE — Progress Notes (Signed)
Pt is not registering on the continuous pulse ox. Tried 2 meters and several probe areas. Ilean Skill LPN

## 2013-06-11 NOTE — Progress Notes (Signed)
[facility]                                                                                Patient Demographics  [name], is a 79 y.o. female, DOB - 1915/04/12, ZOX:[id number]  Admit date - 06-21-13   Admitting Physician Edsel Petrin, DO  Outpatient Primary MD for the patient is Geraldo Pitter, MD  LOS - 1   Chief Complaint  Patient presents with  . Respiratory Distress        Assessment & Plan   Active Problems:   Cardiogenic shock   Severe sepsis with septic shock   NSTEMI (non-ST elevated myocardial infarction)   Lactic acidosis   Hypothermia   Acute renal failure   Hyperkalemia   Hypernatremia   Dementia   Encephalopathy  Severe sepsis with septic and cardiogenic shock   Acute renal failure with hypernatremia and hyperkalemia   Elevated troponin with ischemic changes noted on EKG   Lactic acidosis   Patient currently made comfort care after discussion with her family. We'll continue morphine drip, Ativan as well as scopolamine patch to keep the patient comfortable. Long discussion with the family regarding intervention versus keeping the patient comfortable, they do agree with keeping her comfortable at this time.  Unable to obtain oxygen saturation via numerous pulse oximeters. Blood pressure remains to be in the 50s systolically.  Code Status: DNR/DNI, comfort care  Family Communication: Family at bedside  Disposition Plan: Admitted   Procedures none  Consults  none  DVT Prophylaxis  none Lab Results  Component Value Date   PLT 175 21-Jun-2013    Medications  Scheduled Meds: . scopolamine  1 patch Transdermal Q72H   Continuous Infusions: . morphine 1 mg/hr (06-21-13 1816)   PRN Meds:.LORazepam  Antibiotics    Anti-infectives   Start     Dose/Rate Route Frequency Ordered Stop   June 21, 2013 2200  ceFEPIme (MAXIPIME) 2 g in dextrose 5 % 50 mL IVPB  Status:  Discontinued     2 g 100 mL/hr over 30 Minutes Intravenous Every 12  hours 2013-06-21 1555 21-Jun-2013 1646   2013/06/21 1615  vancomycin (VANCOCIN) IVPB 1000 mg/200 mL premix  Status:  Discontinued     1,000 mg 200 mL/hr over 60 Minutes Intravenous  Once 21-Jun-2013 1602 06-21-13 1646   2013/06/21 1600  vancomycin (VANCOCIN) 15 mg/kg in sodium chloride 0.9 % 100 mL IVPB  Status:  Discontinued     15 mg/kg 100 mL/hr over 60 Minutes Intravenous STAT Jun 21, 2013 1555 June 21, 2013 1602       Time Spent in minutes   30 minutes   Oriel Ojo D.O. on 06/11/2013 at 11:39 AM  Between 7am to 7pm - Pager - [phone]  After 7pm go to [email or web address] - password [email or web address]  And look for the night coverage person covering for me after hours  [facility] Office  [phone]    Subjective:   [name] seen and examined today. Resting comfortably  Objective:   Filed Vitals:   06-21-2013 1820 Jun 21, 2013 1851 21-Jun-2013 1917 06/11/13 0524  BP:  96/45 79/37 56/37   Pulse: 72 69 72 82  Temp:   93.3 F (34.1 C) 97.6  F (36.4 C)  TempSrc:   Axillary Axillary  Resp: 20 24 20 18   Weight:   59.5 kg (131 lb 2.8 oz)   SpO2: 100%   88%    Wt Readings from Last 3 Encounters:  06/30/2013 59.5 kg (131 lb 2.8 oz)     Intake/Output Summary (Last 24 hours) at 06/11/13 1139 Last data filed at 06/11/13 0700  Gross per 24 hour  Intake  12.73 ml  Output      0 ml  Net  12.73 ml    Exam General: Well developed, well nourished, NAD, appears stated age HEENT: NCAT, Anicteic Sclera, mucous membranes moist.  Neck: Supple, no JVD, no masses  Cardiovascular: S1 S2 auscultated, Regular rate and rhythm.  Respiratory: Clear to auscultation bilaterally with equal chest rise Abdomen: Soft, nontender, nondistended, + bowel sounds  Extremities: warm dry without cyanosis clubbing or edema.   Data Review   Micro Results No results found for this or any previous visit (from the past 240 hour(s)).  Radiology Reports Dg Chest 2 View  05/26/2013   CLINICAL DATA:  Shortness of  breath, fever, cough.  EXAM: CHEST  2 VIEW  COMPARISON:  04/27/2011  FINDINGS: Shallow lung volumes. The cardiac silhouette is within normal limits. There is mild diffuse prominence of interstitial markings. No focal regions consolidation or focal infiltrates. Visualized bony skeleton demonstrates superior subluxation of the humeral heads left greater than right.  IMPRESSION: 1. Pulmonary vascular congestion 2. Findings concern for bilateral rotator cuff injury. 3. No focal regions of consolidation or focal infiltrates.   Electronically Signed   By: Salome Holmes M.D.   On: 05/26/2013 15:15   Dg Chest Portable 1 View  06/16/2013   CLINICAL DATA:  Short of breath.  EXAM: PORTABLE CHEST - 1 VIEW  COMPARISON:  05/26/2013.  FINDINGS: Cardiopericardial silhouette within normal limits. Mediastinal contours normal. Trachea midline. No airspace disease or effusion. Monitoring leads project over the chest. Bilateral high-riding humeral head compatible with chronic rotator cuff tears. Aortic arch atherosclerosis.  IMPRESSION: No acute cardiopulmonary disease.   Electronically Signed   By: Andreas Newport M.D.   On: 12-Jun-2013 16:06    CBC  Recent Labs Lab Jun 12, 2013 1511  WBC 16.5*  HGB 15.1*  HCT 44.8  PLT 175  MCV 93.9  MCH 31.7  MCHC 33.7  RDW 14.5  LYMPHSABS 1.4  MONOABS 1.1*  EOSABS 0.0  BASOSABS 0.0    Chemistries   Recent Labs Lab 06/15/2013 1511  NA 161*  K 5.3*  CL 118*  CO2 16*  GLUCOSE 132*  BUN 180*  CREATININE 8.06*  CALCIUM 9.9  AST 47*  ALT 29  ALKPHOS 85  BILITOT 0.8   ------------------------------------------------------------------------------------------------------------------ CrCl is unknown because there is no height on file for the current visit. ------------------------------------------------------------------------------------------------------------------ No results found for this basename: HGBA1C,  in the last 72  hours ------------------------------------------------------------------------------------------------------------------ No results found for this basename: CHOL, HDL, LDLCALC, TRIG, CHOLHDL, LDLDIRECT,  in the last 72 hours ------------------------------------------------------------------------------------------------------------------ No results found for this basename: TSH, T4TOTAL, FREET3, T3FREE, THYROIDAB,  in the last 72 hours ------------------------------------------------------------------------------------------------------------------ No results found for this basename: VITAMINB12, FOLATE, FERRITIN, TIBC, IRON, RETICCTPCT,  in the last 72 hours  Coagulation profile No results found for this basename: INR, PROTIME,  in the last 168 hours  No results found for this basename: DDIMER,  in the last 72 hours  Cardiac Enzymes  Recent Labs Lab 06/24/2013 1511  TROPONINI 0.72*   ------------------------------------------------------------------------------------------------------------------ No components  found with this basename: POCBNP,

## 2013-07-04 NOTE — Progress Notes (Signed)
Body picked up by the [facility] of choice accompanied by Colan Neptune .

## 2013-07-04 NOTE — Progress Notes (Signed)
At 0425 pt with no respiration, no pulse and cold to touch, pt expired confirmed by this  RN and another [employer] [place]. Md notified and ordered to pronounce.Stopped morphine drip and wasted 80 mg witnessed by [employer].

## 2013-07-04 NOTE — Discharge Summary (Signed)
Death Summary  [name]:[id number] DOB: 1915-06-13 DOA: 06-15-2013  PCP: Geraldo Pitter, MD  Admit date: 06-15-13 Date of Death: 2013/06/17  Final Diagnoses:  Active Problems:   Cardiogenic shock   Severe sepsis with septic shock   NSTEMI (non-ST elevated myocardial infarction)   Lactic acidosis   Hypothermia   Acute renal failure   Hyperkalemia   Hypernatremia   Dementia   Encephalopathy    History of present illness:  [name] is a 80 y.o. female With a history of dementia, hypertension, recently treated bronchitis and UTI that presents emergency department with family for altered mental status. Information the sister disc was prepped and centered by the granddaughter. Patient does have a history of underlying dementia however recently her mental status started to wax and wane more than usual. Patient was noted to have bronchitis back in November of 2014 which she was treated for with Levaquin as well as a UTI. She did present to her primary care physician's office approximately one week after her hospitalization was found to continue having bronchitis and upper respiratory infection with some treated with erythromycin. Patient patient has not recently complained of any shortness of breath, headaches, abdominal pain, chest pain.  Hospital Course:  Severe sepsis with septic and cardiogenic shock   Acute renal failure with hypernatremia and hyperkalemia   Elevated troponin with ischemic changes noted on EKG   Lactic acidosis  Patient currently made comfort care after discussion with her family. We'll continue morphine drip, Ativan as well as scopolamine patch to keep the patient comfortable. Long discussion with the family regarding intervention versus keeping the patient comfortable, they do agree with keeping her comfortable at this time.  Unable to obtain oxygen saturation via numerous pulse oximeters. Blood pressure remains to be in the 50s systolically.   Time:  0425  Signed:  Hye Trawick, Scheryl Marten  [facility] 06/17/13, 9:42 AM

## 2013-07-04 DEATH — deceased

## 2014-07-06 IMAGING — CR DG OS CALCIS 2+V*L*
2 series · 2 of 2 positions shown · non-contrast
Comparison: 05/07/2011

CLINICAL DATA: Left heel wound rule out foreign body

LEFT OS CALCIS - 2+ VIEW

[view not recorded (1 of 2)]
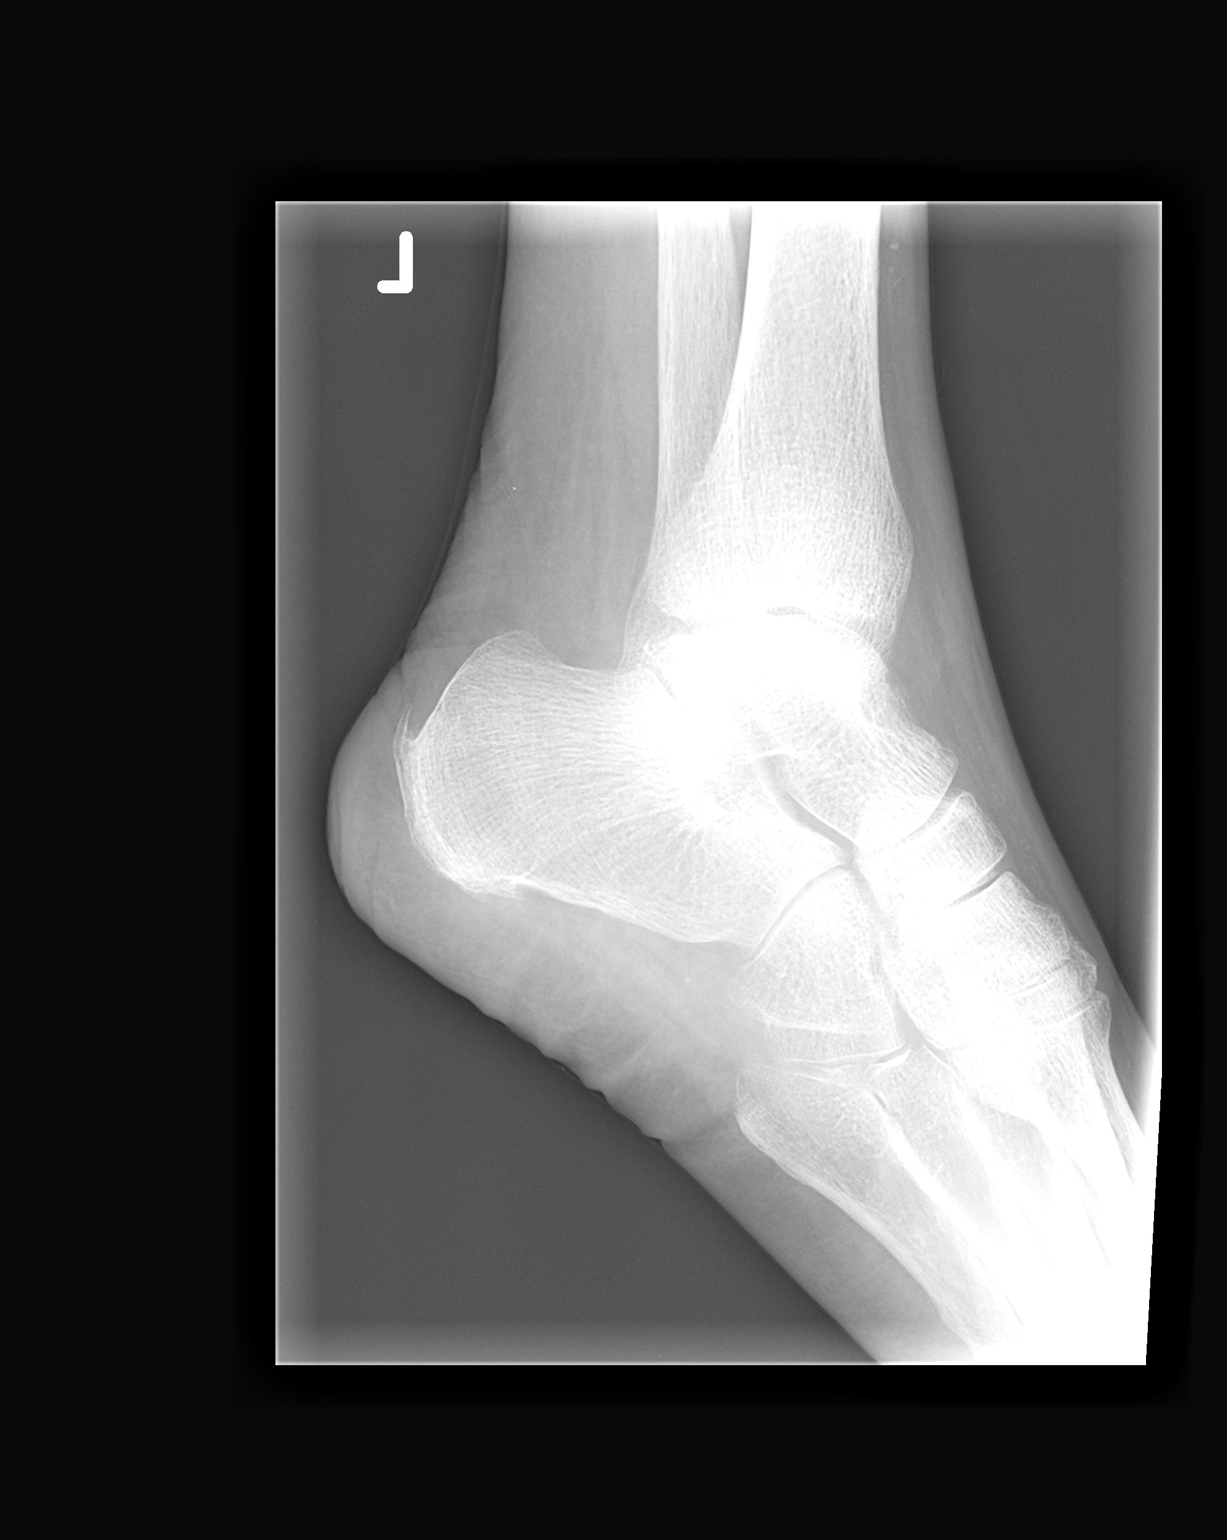

[view not recorded (2 of 2)]
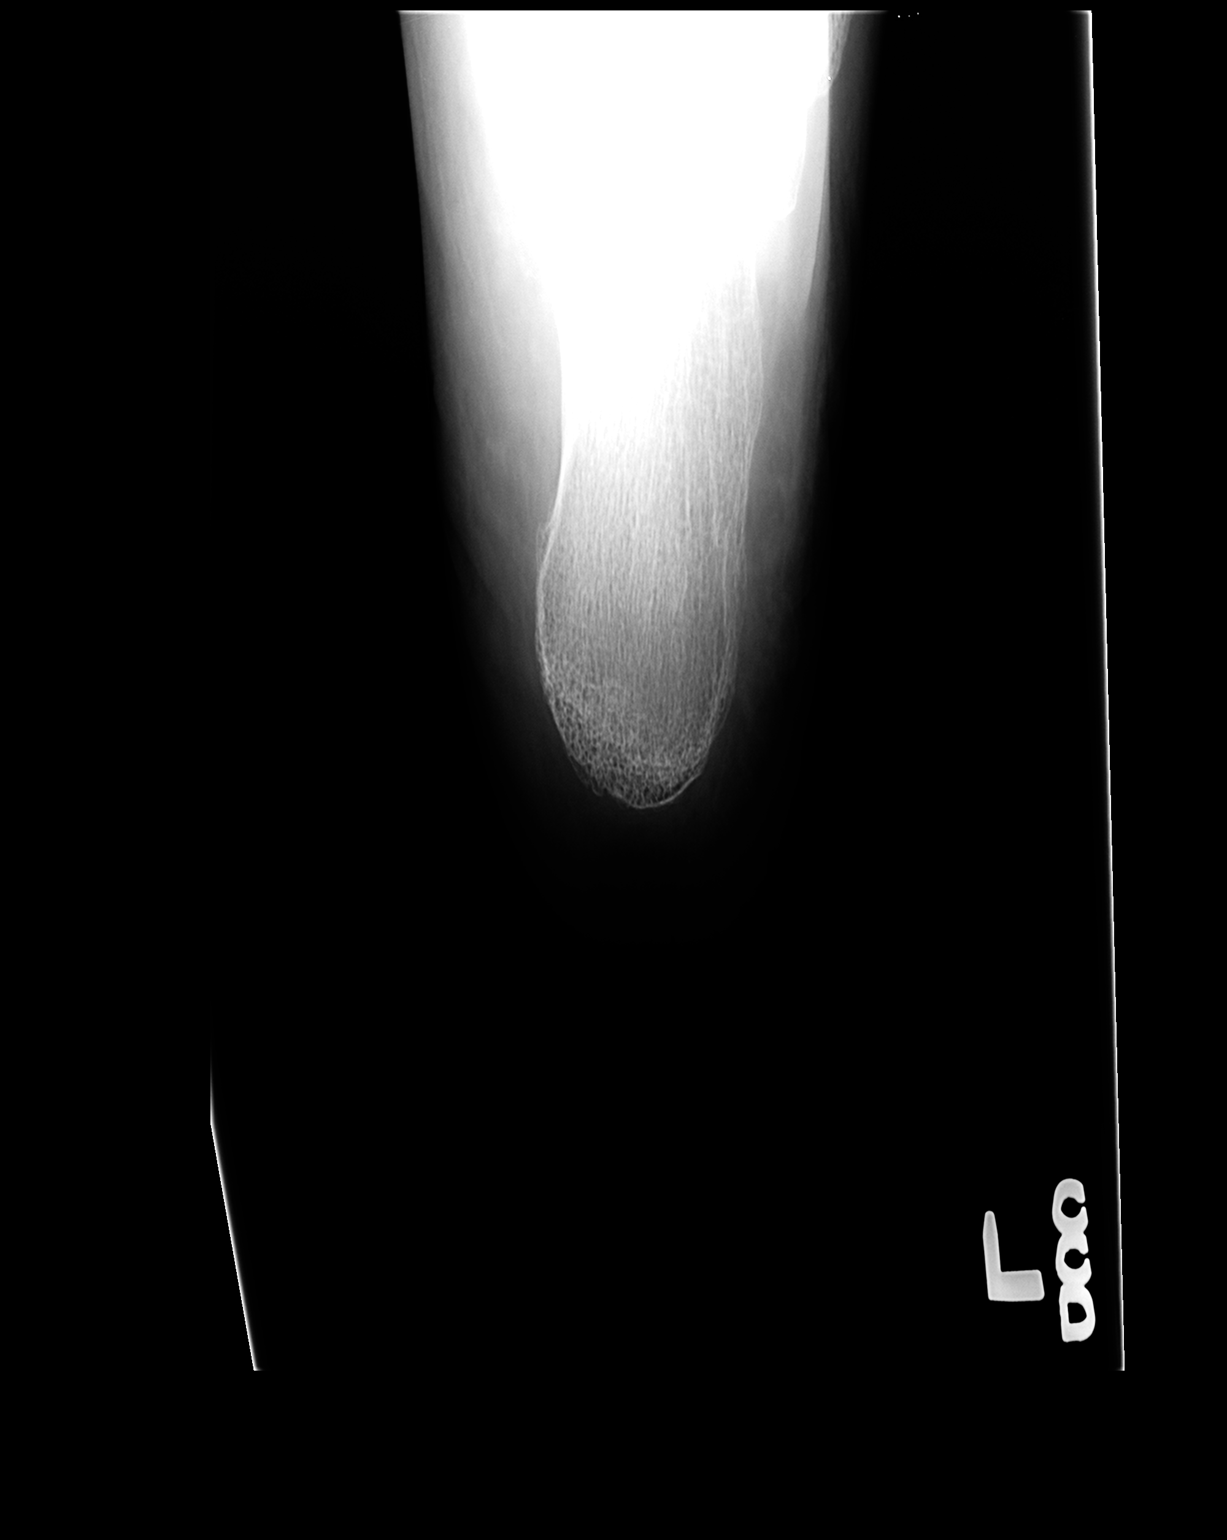

[2 of 2 positions shown; findings below may reference images not displayed]

FINDINGS: Two views of the left calcaneus submitted.  No acute
fracture or subluxation.  Tiny plantar and posterior spur of the
calcaneus.  No radiopaque foreign body.
IMPRESSION: No acute fracture or subluxation.  No periosteal reaction or bony
erosion.  Tiny plantar and posterior spur of the calcaneus.  No
radiopaque foreign body is identified.

## 2015-04-17 IMAGING — CR DG CHEST 2V
2 series · 2 of 2 positions shown · non-contrast
Comparison: 04/27/2011

CLINICAL DATA: Shortness of breath, fever, cough.

EXAM:
CHEST  2 VIEW

[w chest lat]
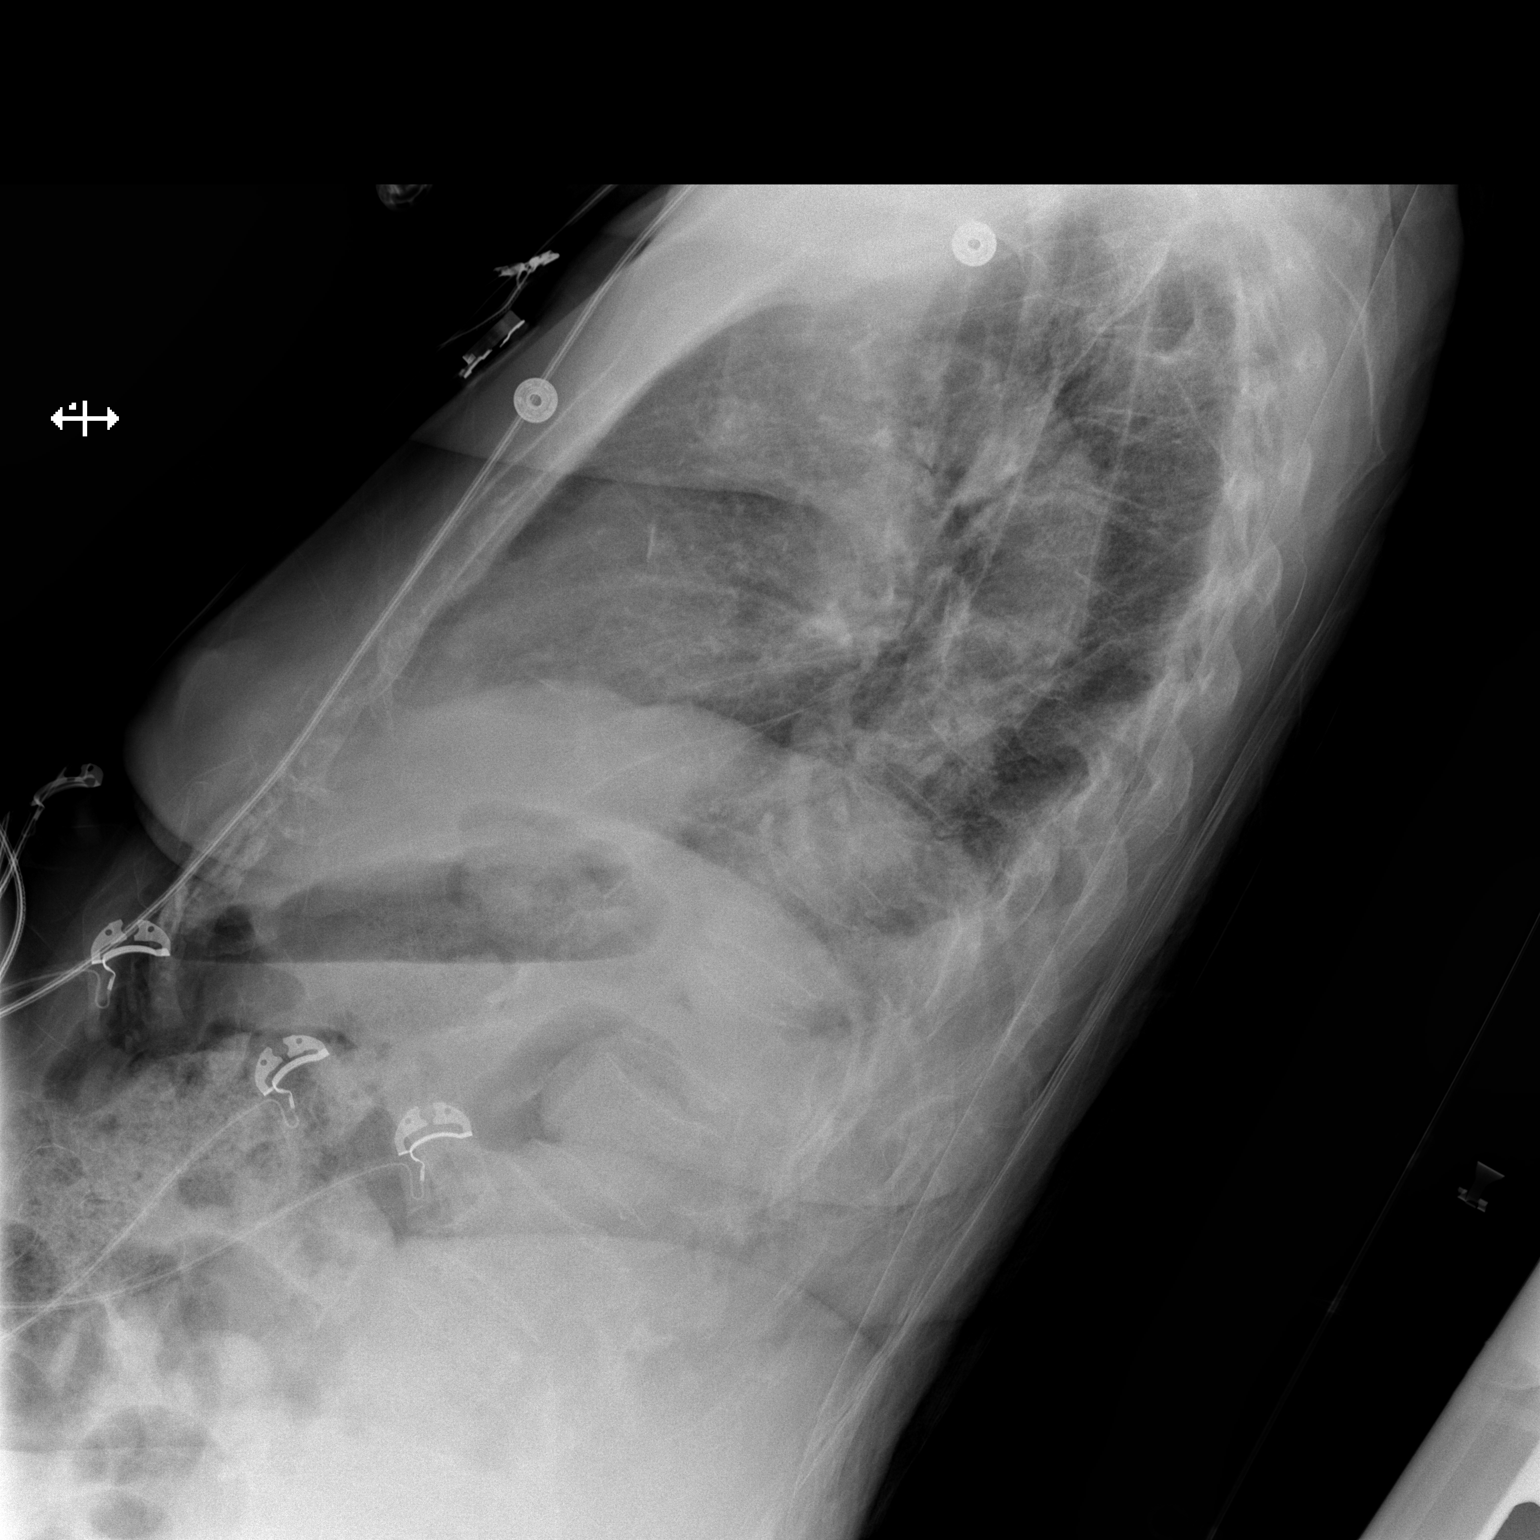

[x chest ap]
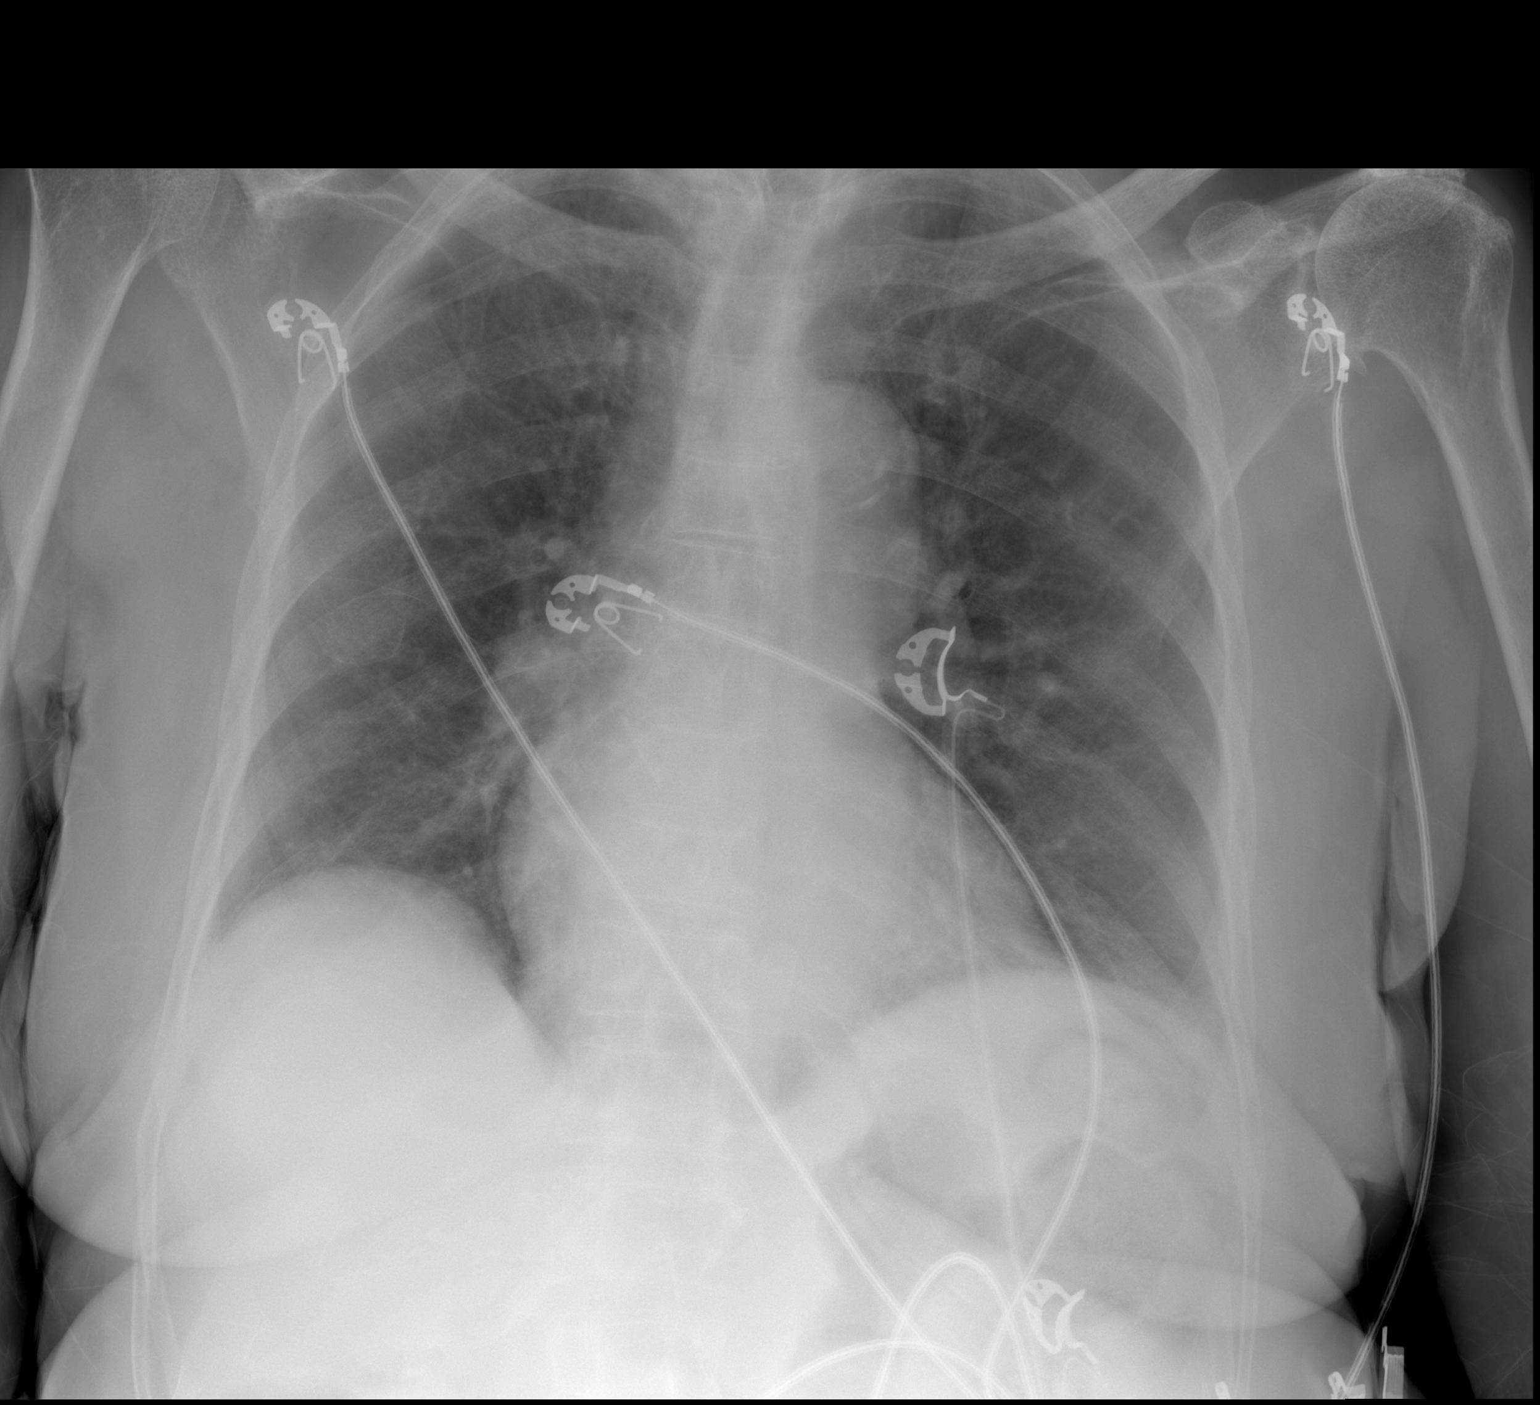

[2 of 2 positions shown; findings below may reference images not displayed]

FINDINGS: Shallow lung volumes. The cardiac silhouette is within normal
limits. There is mild diffuse prominence of interstitial markings.
No focal regions consolidation or focal infiltrates. Visualized bony
skeleton demonstrates superior subluxation of the humeral heads left
greater than right.
IMPRESSION: 1. Pulmonary vascular congestion
2. Findings concern for bilateral rotator cuff injury.
3. No focal regions of consolidation or focal infiltrates.
